# Patient Record
Sex: Male | Born: 1965
Health system: Southern US, Community
[De-identification: ages and names within clinical notes are randomized; demographics above are authoritative.]

## PROBLEM LIST (undated history)

## (undated) DIAGNOSIS — F39 Unspecified mood [affective] disorder: Secondary | ICD-10-CM

## (undated) DIAGNOSIS — F319 Bipolar disorder, unspecified: Secondary | ICD-10-CM

## (undated) HISTORY — DX: Unspecified mood (affective) disorder: F39

---

## 2000-06-14 ENCOUNTER — Emergency Department (HOSPITAL_COMMUNITY): Admission: EM | Admit: 2000-06-14 | Discharge: 2000-06-14 | Payer: Self-pay

## 2009-06-03 HISTORY — PX: CERVICAL DISCECTOMY: SHX98

## 2009-11-09 ENCOUNTER — Ambulatory Visit (HOSPITAL_COMMUNITY): Admission: RE | Admit: 2009-11-09 | Discharge: 2009-11-10 | Payer: Self-pay | Admitting: Orthopedic Surgery

## 2010-08-20 LAB — CBC
MCHC: 35.2 g/dL (ref 30.0–36.0)
MCV: 94.4 fL (ref 78.0–100.0)
Platelets: 203 10*3/uL (ref 150–400)
RBC: 4.49 MIL/uL (ref 4.22–5.81)
RDW: 12.2 % (ref 11.5–15.5)

## 2015-05-05 ENCOUNTER — Ambulatory Visit (INDEPENDENT_AMBULATORY_CARE_PROVIDER_SITE_OTHER): Payer: 59 | Admitting: Family Medicine

## 2015-05-05 VITALS — BP 132/82 | HR 82 | Temp 98.6°F | Resp 16 | Ht 71.0 in | Wt 193.0 lb

## 2015-05-05 DIAGNOSIS — Z23 Encounter for immunization: Secondary | ICD-10-CM | POA: Diagnosis not present

## 2015-05-05 DIAGNOSIS — Z131 Encounter for screening for diabetes mellitus: Secondary | ICD-10-CM | POA: Diagnosis not present

## 2015-05-05 DIAGNOSIS — Z13220 Encounter for screening for lipoid disorders: Secondary | ICD-10-CM | POA: Diagnosis not present

## 2015-05-05 DIAGNOSIS — Z0000 Encounter for general adult medical examination without abnormal findings: Secondary | ICD-10-CM

## 2015-05-05 DIAGNOSIS — Z130 Encounter for screening for diseases of the blood and blood-forming organs and certain disorders involving the immune mechanism: Secondary | ICD-10-CM | POA: Diagnosis not present

## 2015-05-05 LAB — LIPID PANEL
Cholesterol: 240 mg/dL — ABNORMAL HIGH (ref 125–200)
HDL: 45 mg/dL (ref >=40)
LDL CALC: 119 mg/dL (ref <130)
Total CHOL/HDL Ratio: 5.3 Ratio — ABNORMAL HIGH (ref <=5.0)
Triglycerides: 381 mg/dL — ABNORMAL HIGH (ref <150)
VLDL: 76 mg/dL — AB (ref <30)

## 2015-05-05 LAB — CBC
HCT: 45.6 % (ref 39.0–52.0)
Hemoglobin: 15.7 g/dL (ref 13.0–17.0)
MCH: 32.4 pg (ref 26.0–34.0)
MCHC: 34.4 g/dL (ref 30.0–36.0)
MCV: 94.2 fL (ref 78.0–100.0)
MPV: 9.8 fL (ref 8.6–12.4)
Platelets: 250 10*3/uL (ref 150–400)
RBC: 4.84 MIL/uL (ref 4.22–5.81)
RDW: 12.9 % (ref 11.5–15.5)
WBC: 5.8 10*3/uL (ref 4.0–10.5)

## 2015-05-05 LAB — COMPREHENSIVE METABOLIC PANEL
ALT: 25 U/L (ref 9–46)
AST: 17 U/L (ref 10–40)
Albumin: 4.8 g/dL (ref 3.6–5.1)
Alkaline Phosphatase: 46 U/L (ref 40–115)
BILIRUBIN TOTAL: 0.4 mg/dL (ref 0.2–1.2)
BUN: 15 mg/dL (ref 7–25)
CO2: 27 mmol/L (ref 20–31)
CREATININE: 0.81 mg/dL (ref 0.60–1.35)
Calcium: 9 mg/dL (ref 8.6–10.3)
Chloride: 101 mmol/L (ref 98–110)
GLUCOSE: 103 mg/dL — AB (ref 65–99)
Potassium: 3.8 mmol/L (ref 3.5–5.3)
SODIUM: 138 mmol/L (ref 135–146)
Total Protein: 6.9 g/dL (ref 6.1–8.1)

## 2015-05-05 NOTE — Progress Notes (Addendum)
[facility] [address], [place] [address] [phone]  Date:  05/05/2015   Name:  Trevor Yoder   DOB:  1965/07/22   MRN:  [id number]  PCP:  No primary care provider on file.    Chief Complaint: Annual Exam and Immunizations   History of Present Illness:  Trevor Yoder is a 49 y.o. very pleasant male patient who presents with the following:  He is here today for a CPE- new patient today His last tetanus was 2001 he thinks- would like to get a tdap today.  He is no longer a smoker.  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   Past Medical History  Diagnosis Date  . Mood disorder [facility])     Past Surgical History  Procedure Laterality Date  . Cervical discectomy  2011    Social History  Substance Use Topics  . Smoking status: Former Smoker    Quit date: 04/27/2009  . Smokeless tobacco: None  . Alcohol Use: 6.0 oz/week    10 Standard drinks or equivalent per week    Family History  Problem Relation Age of Onset  . Diabetes Mother   . Hyperlipidemia Mother   . Hypertension Mother   . Heart disease Maternal Grandmother   . Cancer Paternal Grandfather     No Known Allergies  Medication list has been reviewed and updated.  No current outpatient prescriptions on file prior to visit.   No current facility-administered medications on file prior to visit.    Review of Systems:  As per HPI- otherwise negative.   Physical Examination: Filed Vitals:   05/05/15 1407  BP: 132/82  Pulse: 82  Temp: 98.6 F (37 C)  Resp: 16   Filed Vitals:   05/05/15 1407  Height: 5\' 11"  (1.803 m)  Weight: 193 lb (87.544 kg)   Body mass index is 26.93 kg/(m^2). Ideal Body Weight: Weight in (lb) to have BMI  = 25: 178.9  GEN: WDWN, NAD, Non-toxic, A & O x 3, looks well.  HEENT: Atraumatic, Normocephalic. Neck supple. No masses, No LAD.  Bilateral TM wnl, oropharynx normal.  PEERL,EOMI.   Ears and Nose: No external deformity. CV: RRR, No M/G/R. No JVD. No thrill. No extra heart sounds. PULM: CTA B, no wheezes, crackles, rhonchi. No retractions. No resp. distress. No accessory muscle use. ABD: S, NT, ND. No rebound. No HSM. EXTR: No c/c/e NEURO Normal gait.  PSYCH: Normally interactive. Conversant. Not depressed or anxious appearing.  Calm demeanor.    Assessment and Plan: Physical exam  Immunization due - Plan: Tdap vaccine greater than or equal to 7yo IM  Screening for deficiency anemia - Plan: CBC  Screening for diabetes mellitus - Plan: Comprehensive metabolic panel, Hemoglobin A1c  Screening for hyperlipidemia - Plan: Lipid panel  CPE today and form for his job He is NOT fasting today- wanted to go ahead and do labs since he is here but may need to re-do chl if high Encouraged exercise- he just got a new puppy and plans to be more active taking her on walks Will plan further follow- up pending labs.   Signed Lamar Blinks, MD  Received his labs and  gave him a call- diabetes test is ok, but he may want to repeat his cholesterol fasting.  Will place an order for this

## 2015-05-05 NOTE — Patient Instructions (Signed)
It was good to see you today- I will be in touch with your labs and will fax in your form when labs are in I hope that you enjoy your new puppy!

## 2015-05-06 LAB — HEMOGLOBIN A1C
HEMOGLOBIN A1C: 5.4 % (ref <5.7)
Mean Plasma Glucose: 108 mg/dL (ref <117)

## 2015-05-06 NOTE — Addendum Note (Signed)
Addended by: Lamar Blinks C on: 05/06/2015 10:29 AM   Modules accepted: Orders

## 2015-05-11 ENCOUNTER — Other Ambulatory Visit (INDEPENDENT_AMBULATORY_CARE_PROVIDER_SITE_OTHER): Payer: 59

## 2015-05-11 DIAGNOSIS — Z13220 Encounter for screening for lipoid disorders: Secondary | ICD-10-CM

## 2015-05-11 LAB — LIPID PANEL
CHOL/HDL RATIO: 4.4 ratio (ref <=5.0)
Cholesterol: 230 mg/dL — ABNORMAL HIGH (ref 125–200)
HDL: 52 mg/dL (ref >=40)
LDL CALC: 133 mg/dL — AB (ref <130)
TRIGLYCERIDES: 224 mg/dL — AB (ref <150)
VLDL: 45 mg/dL — AB (ref <30)

## 2015-05-11 NOTE — Progress Notes (Signed)
Pt. Came in for a lab only visit.

## 2015-05-12 ENCOUNTER — Telehealth: Payer: Self-pay | Admitting: Physician Assistant

## 2015-05-12 NOTE — Telephone Encounter (Signed)
Please call the patient.  I have filled out his form for him to come and pick-up.  His cholesterol was slightly better when he was fasting.  I would work on good healthy eating and exercise.  Dr Lorelei Pont will be in touch with him about the next step.

## 2015-05-14 ENCOUNTER — Encounter: Payer: Self-pay | Admitting: Family Medicine

## 2015-05-14 DIAGNOSIS — E782 Mixed hyperlipidemia: Secondary | ICD-10-CM | POA: Insufficient documentation

## 2015-05-15 NOTE — Telephone Encounter (Signed)
.  Advised form ready to pick up or faxed to destination.

## 2016-11-18 ENCOUNTER — Ambulatory Visit (INDEPENDENT_AMBULATORY_CARE_PROVIDER_SITE_OTHER): Payer: 59 | Admitting: Podiatry

## 2016-11-18 ENCOUNTER — Ambulatory Visit (INDEPENDENT_AMBULATORY_CARE_PROVIDER_SITE_OTHER): Payer: 59

## 2016-11-18 DIAGNOSIS — M205X9 Other deformities of toe(s) (acquired), unspecified foot: Secondary | ICD-10-CM

## 2016-11-18 DIAGNOSIS — R52 Pain, unspecified: Secondary | ICD-10-CM

## 2016-11-20 DIAGNOSIS — M205X9 Other deformities of toe(s) (acquired), unspecified foot: Secondary | ICD-10-CM | POA: Insufficient documentation

## 2016-11-20 NOTE — Progress Notes (Signed)
Subjective:    Patient ID: Trevor Yoder, male   DOB: 51 y.o.   MRN: [id number]   HPI 51 year old male presents the today for concerns of "hallux limitus" to both of his feet with the right side worse than left. States his been ongoing for quite some time but recently become more painful with weightbearing or pressure and doing a lot of walking. He denies any recent njuryor trauma to the area and he denies any recent treatment other than trying to change shoes. Discussed treatment options today for this. He has no other concerns today.  Review of Systems  All other systems reviewed and are negative.       Objective:  Physical Exam General: AAO x3, NAD  Dermatological: Skin is warm, dry and supple bilateral. Nails x 10 are well manicured; remaining integument appears unremarkable at this time. There are no open sores, no preulcerative lesions, no rash or signs of infection present.  Vascular: Dorsalis Pedis artery and Posterior Tibial artery pedal pulses are 2/4 bilateral with immedate capillary fill time. Pedal hair growth present. No varicosities and no lower extremity edema present bilateral. There is no pain with calf compression, swelling, warmth, erythema.   Neruologic: Grossly intact via light touch bilateral. . Protective threshold with Semmes Wienstein monofilament intact to all pedal sites bilateral.   Musculoskeletal: dorsal spurring is present off the first metatarsophalangeal joint bilaterally with the right side worse than the left and there is decreased range of motion of the first MTPJ. There is tenderness the dorsal aspect of the MTPJ on the bone spurs. There is minimal swelling to this area is no erythema or increase in warmth. There is no other area of tenderness identified at this time.Muscular strength 5/5 in all groups tested bilateral.  Gait: Unassisted, Nonantalgic.      Assessment:     51 year old male with bilateral 1st MTPJ hallux limitus right > left     Plan:  -Treatment options discussed including all alternatives, risks, and complications -Etiology of symptoms were discussed -X-rays were obtained and reviewed with the patient. Dorsal spurring present of the first metatarsal bilaterally. Arthritic changes present of the first MTPJ. No evidence of acute fracture identified. -Today with discussed both conservative and surgical treatment options. Conservatively we discussed a custom mold  As well she can modifications and injection therapy. Surgically we discussed with him the first MTPJ arthrodesis or possibly a Cartiva implant. I discussed each of these surgeries as well as the postoperative course as well as expected outcomes. He wouldlikely proceed with surgery but nor another couple of months. He also like an insurance quote at this time which we will get for him. In the meantime discuss continue with supportive shoes, stiffer soled shoe as well as offloading pads.     Celesta Gentile, DPM

## 2016-12-10 ENCOUNTER — Telehealth: Payer: Self-pay | Admitting: *Deleted

## 2016-12-10 NOTE — Telephone Encounter (Signed)
"  I'm calling to see how much time will be needed for my upcoming surgery.  I'll trying to get an estimate for the facility as well as anesthesia.  I have the codes. The first one is [phone]."  A Trevor Yoder takes a hour.  "I'm going to do both feet at the same time because the costs for the surgical center is ridiculous.  Can you tell me how long it will take for the other procedure?  That code is [phone]."  It will take a hour for each foot.  "Thanks, that's all I needed to know."

## 2016-12-11 ENCOUNTER — Telehealth: Payer: Self-pay | Admitting: *Deleted

## 2016-12-11 NOTE — Telephone Encounter (Signed)
"  I'd like to schedule a surgery with Dr. Jacqualyn Posey about six weeks out."  Have you signed consent forms?  "No, I just saw him for an initial visit."  I can put you down tentatively for a date.  What date do you have in mind?  He does surgeries on Wednesdays.  "I'd like to schedule it the last week of August."  He can do it on August 29.  "Please put me down tentatively."  I will transfer you to a scheduler to schedule an appointment for a consultation.  Call was transferred to a scheduler.

## 2016-12-13 ENCOUNTER — Telehealth: Payer: Self-pay | Admitting: Podiatry

## 2016-12-13 NOTE — Telephone Encounter (Signed)
Left voicemail for pt. To call to schedule appointment for surgical consultation

## 2017-01-06 ENCOUNTER — Encounter: Payer: Self-pay | Admitting: Podiatry

## 2017-01-06 ENCOUNTER — Ambulatory Visit (INDEPENDENT_AMBULATORY_CARE_PROVIDER_SITE_OTHER): Payer: 59 | Admitting: Podiatry

## 2017-01-06 DIAGNOSIS — M205X9 Other deformities of toe(s) (acquired), unspecified foot: Secondary | ICD-10-CM

## 2017-01-06 NOTE — Patient Instructions (Signed)

## 2017-01-09 NOTE — Progress Notes (Signed)
Subjective: Mr. Barton presents the office today for concerns of bilateral foot pain on the big toe joints with the left side worse than the right today. Last upon the right side was worse than left he states that besides her intermittently. He states that he was active the last couple weeks he states he is having quite a bit of pain to the big toe joints and directed further discuss surgery. He has attempted shoe changes, offloading pad and a significant improvement. Denies any systemic complaints such as fevers, chills, nausea, vomiting. No acute changes since last appointment, and no other complaints at this time.   Objective: AAO x3, NAD DP/PT pulses palpable bilaterally, CRT less than 3 seconds Decreased range of motion the bilateral first MTPJ as well as dorsal spurring present first MPJ. No significant crepitation with MPJ range of motion however there is pain with range of motion. There is no edema, erythema, increase in warmth. As noted. Tenderness at about this time.  No open lesions or pre-ulcerative lesions.  No pain with calf compression, swelling, warmth, erythema  Assessment: Bilateral hallux limitus   Plan: -All treatment options discussed with the patient including all alternatives, risks, complications.  -X-rays and chart reviewed with patient. -We discussed both conservative and surgical treatment options. At this point he will to go and proceed with surgical intervention. He is actually requesting "Cartiva" implant to bilateral MPJs. He states that he was going to proceed with surgery bilaterally. He states that due to work in rather discoloration to both sides although we did discuss not doing his last appointment but he would like to go ahead and do this. I discussed the surgery as well as the postoperative course. We discussed risks of the surgeries well. -We discussed that this is a newer implant and technique. We do not yet know the long-term results of this. Discussed  with him that the chance of failure and he isn't likely to need further surgery later in life if not sooner. No promises or guarantees were given. -The incision placement as well as the postoperative course was discussed with the patient. I discussed risks of the surgery which include, but not limited to, infection, bleeding, pain, swelling, need for further surgery, delayed or nonhealing, painful or ugly scar, numbness or sensation changes, over/under correction, recurrence, transfer lesions, further deformity, hardware failure, DVT/PE, loss of toe/foot. Patient understands these risks and wishes to proceed with surgery. The surgical consent was reviewed with the patient all 3 pages were signed. No promises or guarantees were given to the outcome of the procedure. All questions were answered to the best of my ability. Before the surgery the patient was encouraged to call the office if there is any further questions. The surgery will be performed at the [facility] on an outpatient basis. -Patient encouraged to call the office with any questions, concerns, change in symptoms.   Celesta Gentile, DPM

## 2017-01-14 ENCOUNTER — Telehealth: Payer: Self-pay | Admitting: *Deleted

## 2017-01-14 NOTE — Telephone Encounter (Addendum)
"  I was in there a week ago.  I was supposed to be scheduling surgery to get some implants in my feet.  I was hoping to get Wednesday August 29.  We spoke before about that being the date.  I just want to make sure that has been confirmed and everything is ready to go.  Please call me back."  "My name is Trevor Yoder.  My husband, Dvontae has a surgery that's upcoming.  I believe it's the 29th of August or in process.  We need some information about the surgery.  We need the date confirmed and other details so we can plan some time off from work.  We need to plan the financial piece of it.  We just really need to fill in some of these missing blanks as soon as we can get information."  I left them a message that I do have him scheduled for surgery on August 29.  Call tomorrow if you have any additional questions.

## 2017-01-16 NOTE — Telephone Encounter (Signed)
"  I'm returning your call.  You tried to reach me today."  I did not call you.  I left you a message the other day that we have you scheduled for August 29 for surgery.  Maybe it was someone from the surgical center.  "It possibly could have been I just filled out all the information on the online portal.

## 2017-01-29 ENCOUNTER — Encounter: Payer: Self-pay | Admitting: Podiatry

## 2017-01-29 DIAGNOSIS — M2022 Hallux rigidus, left foot: Secondary | ICD-10-CM | POA: Diagnosis not present

## 2017-01-29 DIAGNOSIS — M2021 Hallux rigidus, right foot: Secondary | ICD-10-CM | POA: Diagnosis not present

## 2017-01-29 NOTE — Progress Notes (Signed)
Pre-operative Note  Patient presents to the [facility] today for surgical intervention of the left and right foot for hallux limitus. He has elected and came in requesting a Cartiva implant bilaterally. We discussed options and long term success rates and how this is a newer implant and we do not know long term success. The surgical consent was reviewed with the patient and we discussed the procedure as well as the postoperative course. I again discussed all alternatives, risks, complications. I answered all of their questions to the best of my ability and they wish to proceed with surgery. No promises or guarantees were given as to the outcome of the surgery.   The surgical consent was signed.   Patient is NPO since midnight.  The patient does not have have a history of blood clots or bleeding disorders.   No further questions.   Celesta Gentile, [place]

## 2017-01-30 ENCOUNTER — Telehealth: Payer: Self-pay | Admitting: Podiatry

## 2017-01-30 NOTE — Telephone Encounter (Addendum)
Pt states he has been icing over 15 minutes/hour is that okay, was it okay to take the socks off, do movement of ankle and toes with muscles not hands, put boots on loosely to go to the bathroom or move around, and Dr. Jacqualyn Posey had said he could take ibuprofen or aspirin between doses of Percocet. I told pt to ice 15 minutes/hour, but longer was not necessary, okay to take the socks off, but could use loose athletic socks to keep dressings clean and intact, best to have surgery boots on securely to ensure good foot positioning and safety. I initially told pt that he should only take the ibuprofen between the percocet, but pt states he was told by Dr. Jacqualyn Posey to take an aspirin. I told pt to take the ibuprofen as OTC package directs and aspirin as directed by Dr. Jacqualyn Posey. I reviewed the post op care sheet with pt and he states understanding.

## 2017-01-30 NOTE — Telephone Encounter (Signed)
I had surgery on both of my feet yesterday by Dr. Jacqualyn Posey and I just have a couple of questions.

## 2017-02-06 ENCOUNTER — Ambulatory Visit (INDEPENDENT_AMBULATORY_CARE_PROVIDER_SITE_OTHER): Payer: 59

## 2017-02-06 ENCOUNTER — Ambulatory Visit (INDEPENDENT_AMBULATORY_CARE_PROVIDER_SITE_OTHER): Payer: Self-pay | Admitting: Podiatry

## 2017-02-06 ENCOUNTER — Ambulatory Visit: Payer: 59

## 2017-02-06 DIAGNOSIS — M205X9 Other deformities of toe(s) (acquired), unspecified foot: Secondary | ICD-10-CM

## 2017-02-11 NOTE — Progress Notes (Signed)
Subjective: Trevor Yoder is a 51 y.o. is seen today in office s/p bilateral chilectomy with Cativa implant bilateal preformed on 01/29/2017. They state their pain is controlled currently. He has finished her course of his pain medication and does not want a refill. He is remain in surgical shoes bilaterally. Denies any systemic complaints such as fevers, chills, nausea, vomiting. No calf pain, chest pain, shortness of breath.   Objective: General: No acute distress, AAOx3  DP/PT pulses palpable 2/4, CRT < 3 sec to all digits.  Protective sensation intact. Motor function intact.  Bialtearl foot: Incision is well coapted without any evidence of dehiscence and sutures are inact. There is no surrounding erythema, ascending cellulitis, fluctuance, crepitus, malodor, drainage/purulence. There is mild edema around the surgical site. There is minimal pain along the surgical site. Ecchymosis present the digits. There is no apparent MPJ range of motion today. No other areas of tenderness to bilateral lower extremities.  No other open lesions or pre-ulcerative lesions.  No pain with calf compression, swelling, warmth, erythema.   Assessment and Plan:  Status post bilateral foot surgery, doing well with no complications   -Treatment options discussed including all alternatives, risks, and complications -X-rays were obtained and reviewed with the patient. Evidence of implant in the first metatarsal phalangeal joint. Status post cheilectomy. No evidence of acute fracture. -Antibiotic ointment was applied followed by a bandage. Keep the dressing clean, dry, intact -Remain in surgical shoes at all times -Ice/elevation -Pain medication as needed. -Monitor for any clinical signs or symptoms of infection and DVT/PE and directed to call the office immediately should any occur or go to the ER. -Follow-up as scheduled for suture removal or sooner if any problems arise. In the meantime, encouraged to call the  office with any questions, concerns, change in symptoms.   Celesta Gentile, DPM

## 2017-02-13 ENCOUNTER — Ambulatory Visit (INDEPENDENT_AMBULATORY_CARE_PROVIDER_SITE_OTHER): Payer: Self-pay | Admitting: Podiatry

## 2017-02-13 ENCOUNTER — Encounter: Payer: Self-pay | Admitting: Podiatry

## 2017-02-13 DIAGNOSIS — M205X9 Other deformities of toe(s) (acquired), unspecified foot: Secondary | ICD-10-CM

## 2017-02-13 NOTE — Progress Notes (Signed)
Subjective: Trevor Yoder is a 51 y.o. is seen today in office s/p bilateral chilectomy with Cativa implant bilateal preformed on 01/29/2017. They state their pain is controlled currently and not taking any medication. He does state that he walks without the surgical shoes while at the house but does wear them while at work. He does ice his foot as much as he can. He states that he gets some occasional discomfort the toes already feel better. He states that he does a lot of walking his "foot will let me know" he states is not pain just some discomfort.  Denies any systemic complaints such as fevers, chills, nausea, vomiting. No calf pain, chest pain, shortness of breath.   Objective: General: No acute distress, AAOx3  DP/PT pulses palpable 2/4, CRT < 3 sec to all digits.  Protective sensation intact. Motor function intact.  Bilateral feet: Incision is well coapted without any evidence of dehiscence and sutures are inact.  Incision appears to be healing well. There is no drainage or pus there is no surrounding erythema or ascending synovitis. There is no fluctuance or crepitus. There is no clinical signs of infection present. Ecchymosis of the hallux but this appears to be improving. There is improved range of motion the first MPJs bilaterally there is no pain with MPJ range of motion today there is no crepitation. Subjective numbness along the interspace on the right foot. There is mild swelling that continues but appears to be improved compared to last appointment.  No other areas of tenderness to bilateral lower extremities.  No other open lesions or pre-ulcerative lesions.  No pain with calf compression, swelling, warmth, erythema.   Assessment and Plan:  Status post bilateral foot surgery, doing well with no complications   -Treatment options discussed including all alternatives, risks, and complications -Suture ends were cut today. Steri-Strips are applied for reinforcement although the  incision remained well coapted. Antibiotic when it was applied followed by a bandage. -He can start to shower tomorrow. Keep antibiotic ointment and a bandage on. -Surgical shoe at all times. -Gentle range of motion exercises of the first MPJ -Ice and elevation -Limit activity -Monitor for any clinical signs or symptoms of infection and DVT/PE and directed to call the office immediately should any occur or go to the ER. -Follow-up in 2 weeks or sooner if any problems arise. In the meantime, encouraged to call the office with any questions, concerns, change in symptoms.   Celesta Gentile, DPM

## 2017-02-27 ENCOUNTER — Ambulatory Visit (INDEPENDENT_AMBULATORY_CARE_PROVIDER_SITE_OTHER): Payer: 59 | Admitting: Podiatry

## 2017-02-27 ENCOUNTER — Encounter: Payer: Self-pay | Admitting: Podiatry

## 2017-02-27 ENCOUNTER — Ambulatory Visit (INDEPENDENT_AMBULATORY_CARE_PROVIDER_SITE_OTHER): Payer: 59

## 2017-02-27 DIAGNOSIS — M205X9 Other deformities of toe(s) (acquired), unspecified foot: Secondary | ICD-10-CM

## 2017-02-27 NOTE — Progress Notes (Signed)
Subjective: Trevor Yoder is a 51 y.o. is seen today in office s/p bilateral chilectomy with Cativa implant bilateal preformed on 01/29/2017. He states his been doing well. His his feet when the surgical shoes becoming more active and overall he is doing better. He states the left side is doing better and the right side. He states he is ready to bring to regular shoe although he still has some swelling which was difficult but he has tried. Denies any recent injury or trauma. Denies any systemic complaints such as fevers, chills, nausea, vomiting. No calf pain, chest pain, shortness of breath.   Objective: General: No acute distress, AAOx3  DP/PT pulses palpable 2/4, CRT < 3 sec to all digits.  Protective sensation intact. Motor function intact.  Bilateral feet: Incision is well coapted without any evidence of dehiscence and sutures are inact.  Incision appears to be healing well. There is no drainage or pus there is no surrounding erythema or ascending cellulitis. There is no fluctuance or crepitus. There is no clinical signs of infection present. Ecchymosis of the hallux but this appears to be improving and minimal. There is improved range of motion the first MPJs bilaterally there is no pain with MPJ range of motion today there is no crepitation. Saw his some numbness the interspace and the right foot but overall this appears to be improved. There is mild swelling to the right foot compared to the left foot area No other areas of tenderness to bilateral lower extremities.  No other open lesions or pre-ulcerative lesions.  No pain with calf compression, swelling, warmth, erythema.   Assessment and Plan:  Status post bilateral foot surgery, doing well with no complications   -Treatment options discussed including all alternatives, risks, and complications -X-rays were taken and reviewed. Status post implant the first metatarsal head. No evidence of acute fracture. -There is a single suture which  remained intact the proximal aspect incision on the right foot which is admitted to without any complications. Continued antibiotic ointment dressing changes daily. -Continue surgical shoes, ice, elevation. We will start physical therapy and prescription was provided for this today. He can transition to regular shoe as tolerated. -Monitor for any clinical signs or symptoms of infection and directed to call the office immediately should any occur or go to the ER. -RTC 3 weeks or sooner if needed.  Celesta Gentile, DPM

## 2017-03-11 NOTE — Progress Notes (Signed)
DOS 01/29/17 LT and Rt foot cleaning of big toe joints to remove bone spurs and application of Cartiva implants into bilateral big toe joints

## 2017-03-20 ENCOUNTER — Ambulatory Visit (INDEPENDENT_AMBULATORY_CARE_PROVIDER_SITE_OTHER): Payer: 59 | Admitting: Podiatry

## 2017-03-20 ENCOUNTER — Encounter: Payer: Self-pay | Admitting: Podiatry

## 2017-03-20 ENCOUNTER — Ambulatory Visit (INDEPENDENT_AMBULATORY_CARE_PROVIDER_SITE_OTHER): Payer: 59

## 2017-03-20 DIAGNOSIS — M779 Enthesopathy, unspecified: Secondary | ICD-10-CM

## 2017-03-20 DIAGNOSIS — M205X9 Other deformities of toe(s) (acquired), unspecified foot: Secondary | ICD-10-CM | POA: Diagnosis not present

## 2017-03-20 DIAGNOSIS — M7750 Other enthesopathy of unspecified foot: Secondary | ICD-10-CM

## 2017-03-20 NOTE — Progress Notes (Signed)
Subjective: Trevor Yoder is a 51 y.o. is seen today in office s/p bilateral chilectomy with Cativa implant bilateal preformed on 01/29/2017. He states that he is doing better. He has returned to regular shoe. He states he has been active he still gets some discomfort to the surgical sites. He does feel it is somewhat improved compared to prior to surgery. He dusted that he jammed his right big toe and had increasing pain after this however the pain is subsiding. No other injury. He has no other concerns today. Denies any systemic complaints such as fevers, chills, nausea, vomiting. No calf pain, chest pain, shortness of breath.   Objective: General: No acute distress, AAOx3  DP/PT pulses palpable 2/4, CRT < 3 sec to all digits.  Protective sensation intact. Motor function intact.  Bilateral feet: Incision is well coapted without any evidence of dehiscence and a scar has formed. Swelling continues on the right first MPJ although about the same or mildly improved. There is no erythema or increase in warmth. There is no clinical signs of infection present. There is no pain with MPJ range of motion today. He states he still gets some soreness like he was having prior to surgery but it is improved compared to before surgery he believes. Is no clinical signs of infection present bilaterally. No other open lesions or pre-ulcerative lesions.  No pain with calf compression, swelling, warmth, erythema.   Assessment and Plan:  Status post bilateral foot surgery  -Treatment options discussed including all alternatives, risks, and complications -X-rays were taken and reviewed. Status post implant the first metatarsal head. No evidence of acute fracture. The implant does appear to be subsided.  -At this point he states that he was hoping for some more improvement at this point postoperatively we does feel he has made some improvement. He also states that he knew that this was a new procedure and unsure of how  long the last. However at this time he is still 6 weeks and surgery and his pain has improved compared to preop. I will let to go ahead and get him molded for orthotics today. He was seen today by Liliane Channel for this.  -Continue to ice -Gradual return to activity.  -PT -RTC in 3 weeks or sooner if needed.   Celesta Gentile, DPM  ----- Patient brought in by dr wagoner for scan/casting for CMFO to address FHL.  patietn scanned ST neutral w/ 1st Ray plantarflexed, hallux dorsiflexed.  Richy to fab semi rigid device, deep heel cup, spenco cover w/ reverse morton extension to keep first ray dropped to enhance windlass effect in first MPJ. Keiara Sneeringer Cped

## 2017-04-10 ENCOUNTER — Ambulatory Visit (INDEPENDENT_AMBULATORY_CARE_PROVIDER_SITE_OTHER): Payer: 59

## 2017-04-10 ENCOUNTER — Ambulatory Visit (INDEPENDENT_AMBULATORY_CARE_PROVIDER_SITE_OTHER): Payer: 59 | Admitting: Podiatry

## 2017-04-10 DIAGNOSIS — M205X9 Other deformities of toe(s) (acquired), unspecified foot: Secondary | ICD-10-CM

## 2017-04-10 NOTE — Addendum Note (Signed)
Addended by: Celesta Gentile R on: 04/10/2017 07:16 PM   Modules accepted: Level of Service

## 2017-04-10 NOTE — Addendum Note (Signed)
Addended by: Cranford Mon R on: 04/10/2017 09:02 AM   Modules accepted: Orders

## 2017-04-10 NOTE — Progress Notes (Addendum)
  Trevor Yoder: Patient came in today to pick up custom made foot orthotics.  The goals were accomplished and the patient reported no dissatisfaction with said orthotics.  Patient was advised of breakin period and how to report any issues.  ----------------------  Subjective: Trevor Yoder is a 51 y.o. is seen today in office s/p bilateral chilectomy with Cativa implant bilateal preformed on 01/29/2017.  He feels that overall he is doing better.  He finished physical therapy he felt that he could do the same thing at home that he can accomplish by going.  Overall since his last appointment he has improved and he states that he is happy with the outcome of the surgery so far.  Overall his pain has improved compared to what it was prior to surgery.  He also states the swelling has improved.  He denies any redness or warmth.  He has been wearing a regular shoe.  He also presents today to pick up orthotics.  See above note.  He has no other concerns today.Denies any systemic complaints such as fevers, chills, nausea, vomiting. No calf pain, chest pain, shortness of breath.   Objective: General: No acute distress, AAOx3  DP/PT pulses palpable 2/4, CRT < 3 sec to all digits.  Protective sensation intact. Motor function intact.  Bilateral feet: Incision is well coapted without any evidence of dehiscence and a scar has formed.  Swelling appears to be improved.  There is no erythema, increase in warmth.  There is no tenderness palpation along the surgical sites.  Range of motion intact without any pain, crepitation.  Overall his symptoms have improved and he states he is happy with the outcome of the surgery at this point.  He has no other areas of tenderness identified today. No open lesions or pre-ulcerative lesion identified. No pain with calf compression, swelling, warmth, erythema.   Assessment and Plan:  Status post bilateral foot surgery, with improved symptoms  -Treatment options discussed including all  alternatives, risks, and complications -At this point he is doing well.  He is continued range of motion, rehab exercises at home and he feels that he has been making good improvements.  Orthotics were dispensed today by Liliane Channel.  On him to continue with this as well as well as supportive shoes.  Gradually continue to increase activity level.  At this point as he is doing well we will see him back as an as-needed basis and he agrees with this plan.  However, I encouraged him to call any questions, concerns or any change in symptoms.  Velna Ochs DPM Pedorthist

## 2018-04-20 ENCOUNTER — Other Ambulatory Visit: Payer: Self-pay | Admitting: Family Medicine

## 2018-04-20 DIAGNOSIS — N62 Hypertrophy of breast: Secondary | ICD-10-CM

## 2018-04-20 DIAGNOSIS — N6320 Unspecified lump in the left breast, unspecified quadrant: Secondary | ICD-10-CM

## 2018-04-28 ENCOUNTER — Other Ambulatory Visit: Payer: Self-pay

## 2018-04-29 ENCOUNTER — Ambulatory Visit: Payer: Self-pay

## 2018-04-29 ENCOUNTER — Ambulatory Visit
Admission: RE | Admit: 2018-04-29 | Discharge: 2018-04-29 | Disposition: A | Discharge status: Home / Self Care | Payer: BLUE CROSS/BLUE SHIELD | Source: Ambulatory Visit | Attending: Family Medicine | Admitting: Family Medicine

## 2018-04-29 DIAGNOSIS — N62 Hypertrophy of breast: Secondary | ICD-10-CM

## 2018-04-29 DIAGNOSIS — N6320 Unspecified lump in the left breast, unspecified quadrant: Secondary | ICD-10-CM

## 2018-05-05 ENCOUNTER — Other Ambulatory Visit: Payer: Self-pay

## 2018-05-05 MED ORDER — TRAZODONE HCL 100 MG PO TABS: ORAL_TABLET | ORAL | 2 refills | Status: DC

## 2018-07-10 ENCOUNTER — Telehealth: Payer: Self-pay | Admitting: Physician Assistant

## 2018-07-10 NOTE — Telephone Encounter (Signed)
Patient called and said that he wants to talk to you about his carbonmanizapine dosage. Please call him at [phone]

## 2018-07-13 NOTE — Telephone Encounter (Signed)
I didn't get his chart.  I'm pretty sure he missed an appt recently so I would like him to come in within the next month or so. If he needs the CBZ, would you please send in a month's supply (3 is okay too, whatever he usually gets.)  Thanks

## 2018-07-13 NOTE — Telephone Encounter (Signed)
Pt states he adjusted his carbamazepine dose to 200mg  in the am and 200mg  in the pm, it's helping his mood considerably. Wanted to let you know, if he needs an appt sooner he can schedule?  Uses [facility] on [address]   Not seen in epic will put paper chart in box

## 2018-07-14 ENCOUNTER — Other Ambulatory Visit: Payer: Self-pay

## 2018-07-14 MED ORDER — CARBAMAZEPINE 100 MG PO CHEW: CHEWABLE_TABLET | ORAL | 0 refills | Status: DC

## 2018-07-14 NOTE — Telephone Encounter (Signed)
Rx submitted to pharmacy, usually gets a 90 day supply. Will have him schedule office visit.

## 2018-07-15 ENCOUNTER — Encounter: Payer: Self-pay | Admitting: Physician Assistant

## 2018-07-15 ENCOUNTER — Ambulatory Visit: Payer: 59 | Admitting: Physician Assistant

## 2018-07-15 DIAGNOSIS — Z79899 Other long term (current) drug therapy: Secondary | ICD-10-CM | POA: Diagnosis not present

## 2018-07-15 DIAGNOSIS — F39 Unspecified mood [affective] disorder: Secondary | ICD-10-CM | POA: Diagnosis not present

## 2018-07-15 MED ORDER — CARBAMAZEPINE 100 MG PO CHEW: 200.0000 mg | CHEWABLE_TABLET | Freq: Two times a day (BID) | ORAL | 0 refills | Status: DC

## 2018-07-15 NOTE — Progress Notes (Signed)
[facility]  Patient ID: Trevor Yoder,  MRN: [id number]  PCP: London Pepper, MD  Date of Evaluation: 07/15/2018 Time spent:15 minutes  Chief Complaint:  Chief Complaint    Manic Behavior      HISTORY/CURRENT STATUS: HPI For the past 3 weeks or so, has been more 'mouthy', irritable, 'I can't talk fast enough, my mind won't shut up.  About 5 days ago, he increased the CBZ to total of 400mg /d.  He already feels better.  He had not been sleeping as well and has been having more energy as well.  No increased spending or increased libido.  Patient denies loss of interest in usual activities and is able to enjoy things.  Denies decreased energy or motivation.  Appetite has not changed.  No extreme sadness, tearfulness, or feelings of hopelessness.  Denies any changes in concentration, making decisions or remembering things.  Denies suicidal or homicidal thoughts.  Denies muscle or joint pain, stiffness, or dystonia.  Denies dizziness, syncope, seizures, numbness, tingling, tremor, tics, unsteady gait, slurred speech, confusion.   Individual Medical History/ Review of Systems: Changes? :No    Past medications for mental health diagnoses include: Equetro, trazodone  Allergies: Patient has no known allergies.  Current Medications:  Current Outpatient Medications:  .  carbamazepine (TEGRETOL) 100 MG chewable tablet, Chew 2 tablets (200 mg total) by mouth 2 (two) times daily. 2 qam, 2 qhs, Disp: 360 tablet, Rfl: 0 .  traZODone (DESYREL) 100 MG tablet, Take 1-2 Tablets by mouth at Bedtime as needed for sleep., Disp: 180 tablet, Rfl: 2 .  oxyCODONE-acetaminophen (PERCOCET/ROXICET) 5-325 MG tablet, TAKE 1-2 TABLETS BY MOUTH EVERY 4-6HRS AS NEEDED FOR PAIN, Disp: , Rfl: 0 .  promethazine (PHENERGAN) 25 MG tablet, TAKE 1 TABLET BY MOUTH EVERY 8 HOURS AS NEEDED FOR A NAUSEA / VOMITING, Disp: , Rfl: 0 Medication Side Effects: none  Family Medical/ Social History: Changes?  No  MENTAL HEALTH EXAM:  There were no vitals taken for this visit.There is no height or weight on file to calculate BMI.  General Appearance: Casual and Well Groomed  Eye Contact:  Good  Speech:  Clear and Coherent  Volume:  Normal  Mood:  Euthymic  Affect:  Appropriate  Thought Process:  Goal Directed  Orientation:  Full (Time, Place, and Person)  Thought Content: Logical   Suicidal Thoughts:  No  Homicidal Thoughts:  No  Memory:  WNL  Judgement:  Good  Insight:  Good  Psychomotor Activity:  Normal  Concentration:  Concentration: Good  Recall:  Good  Fund of Knowledge: Good  Language: Good  Assets:  Desire for Improvement  ADL's:  Intact  Cognition: WNL  Prognosis:  Good    DIAGNOSES:    ICD-10-CM   1. Episodic mood disorder (HCC) F39   2. Encounter for long-term (current) use of medications Z79.899 CBC with Differential/Platelet    Comprehensive metabolic panel    Carbamazepine level, total    Receiving Psychotherapy: No    RECOMMENDATIONS: Continue Tegretol 100 mg 2 p.o. twice daily. Continue trazodone 100 mg 1-2 nightly as needed. Obtain CBC level, CBC with differential, CMP in approximately 1 to 2 weeks. Return in 6 weeks.   Donnal Moat, PA-C

## 2018-07-20 ENCOUNTER — Encounter: Payer: Self-pay | Admitting: Emergency Medicine

## 2018-07-20 DIAGNOSIS — F39 Unspecified mood [affective] disorder: Secondary | ICD-10-CM | POA: Insufficient documentation

## 2018-08-19 LAB — COMPREHENSIVE METABOLIC PANEL
ALK PHOS: 48 IU/L (ref 39–117)
ALT: 17 IU/L (ref 0–44)
AST: 16 IU/L (ref 0–40)
Albumin/Globulin Ratio: 2.8 — ABNORMAL HIGH (ref 1.2–2.2)
Albumin: 4.7 g/dL (ref 3.8–4.9)
BUN/Creatinine Ratio: 12 (ref 9–20)
BUN: 11 mg/dL (ref 6–24)
Bilirubin Total: 0.4 mg/dL (ref 0.0–1.2)
CO2: 25 mmol/L (ref 20–29)
Calcium: 9.1 mg/dL (ref 8.7–10.2)
Chloride: 93 mmol/L — ABNORMAL LOW (ref 96–106)
Creatinine, Ser: 0.92 mg/dL (ref 0.76–1.27)
GFR calc Af Amer: 111 mL/min/{1.73_m2} (ref >59)
GFR calc non Af Amer: 96 mL/min/{1.73_m2} (ref >59)
Globulin, Total: 1.7 g/dL (ref 1.5–4.5)
Glucose: 110 mg/dL — ABNORMAL HIGH (ref 65–99)
Potassium: 4.4 mmol/L (ref 3.5–5.2)
Sodium: 133 mmol/L — ABNORMAL LOW (ref 134–144)
Total Protein: 6.4 g/dL (ref 6.0–8.5)

## 2018-08-19 LAB — CBC WITH DIFFERENTIAL/PLATELET
Basophils Absolute: 0 10*3/uL (ref 0.0–0.2)
Basos: 0 %
EOS (ABSOLUTE): 0 10*3/uL (ref 0.0–0.4)
Eos: 0 %
Hematocrit: 39 % (ref 37.5–51.0)
Hemoglobin: 14.2 g/dL (ref 13.0–17.7)
Immature Grans (Abs): 0 10*3/uL (ref 0.0–0.1)
Immature Granulocytes: 0 %
Lymphocytes Absolute: 1.1 10*3/uL (ref 0.7–3.1)
Lymphs: 18 %
MCH: 32.7 pg (ref 26.6–33.0)
MCHC: 36.4 g/dL — ABNORMAL HIGH (ref 31.5–35.7)
MCV: 90 fL (ref 79–97)
MONOS ABS: 0.5 10*3/uL (ref 0.1–0.9)
Monocytes: 9 %
Neutrophils Absolute: 4.3 10*3/uL (ref 1.4–7.0)
Neutrophils: 73 %
PLATELETS: 227 10*3/uL (ref 150–450)
RBC: 4.34 x10E6/uL (ref 4.14–5.80)
RDW: 11.8 % (ref 11.6–15.4)
WBC: 6 10*3/uL (ref 3.4–10.8)

## 2018-08-19 LAB — CARBAMAZEPINE LEVEL, TOTAL: Carbamazepine (Tegretol), S: 5.7 ug/mL (ref 4.0–12.0)

## 2018-08-19 NOTE — Progress Notes (Signed)
Left voice mail to call back 

## 2018-08-19 NOTE — Progress Notes (Signed)
Let him know labs are good.  Liver tests were fine. Carbamazepine level is good.  Stay on same dose of med.

## 2018-08-20 ENCOUNTER — Ambulatory Visit: Payer: 59 | Admitting: Physician Assistant

## 2018-09-02 ENCOUNTER — Ambulatory Visit (INDEPENDENT_AMBULATORY_CARE_PROVIDER_SITE_OTHER): Payer: 59 | Admitting: Physician Assistant

## 2018-09-02 ENCOUNTER — Encounter: Payer: Self-pay | Admitting: Physician Assistant

## 2018-09-02 DIAGNOSIS — F39 Unspecified mood [affective] disorder: Secondary | ICD-10-CM

## 2018-09-02 DIAGNOSIS — G4700 Insomnia, unspecified: Secondary | ICD-10-CM | POA: Diagnosis not present

## 2018-09-02 MED ORDER — CARBAMAZEPINE 100 MG PO CHEW: 200.0000 mg | CHEWABLE_TABLET | Freq: Two times a day (BID) | ORAL | 1 refills | Status: DC

## 2018-09-02 NOTE — Progress Notes (Addendum)
[facility]  Patient ID: Trevor Yoder,  MRN: [id number]  PCP: London Pepper, MD  Date of Evaluation: 09/02/2018 Time spent:15 minutes  Chief Complaint:  Chief Complaint    Follow-up     Virtual Visit via Telephone Note  I connected with Trevor Yoder on 09/02/18 at  4:00 PM EDT by telephone and verified that I am speaking with the correct person using two identifiers.   I discussed the limitations, risks, security and privacy concerns of performing an evaluation and management service by telephone and the availability of in person appointments. I also discussed with the patient that there may be a patient responsible charge related to this service. The patient expressed understanding and agreed to proceed.    HISTORY/CURRENT STATUS: HPI for 6-week med check  A few days prior to the last visit, the Tegretol had been increased from 300 mg daily to a total of 400 mg daily.  He was having increased irritability and anger.  Since then, those symptoms have improved greatly.  He had a very long hard project to deal with at work and feels like he was slightly manic during that time, not needing much sleep and having a lot more energy.  That was a good thing however because he ended up working 200 hours in 3 weeks time.  He has not had any impulsivity or risky behaviors, no increased libido or increased spending.  He is back to normal as far as the energy as well as his sleep.  He is actually sleeping really good with the trazodone, plus the fact that he is able to work from home now, he feels more rested for some reason.  Patient denies loss of interest in usual activities and is able to enjoy things.  Denies decreased energy or motivation.  Appetite has not changed.  No extreme sadness, tearfulness, or feelings of hopelessness.  Denies any changes in concentration, making decisions or remembering things.  Denies suicidal or homicidal thoughts.  Denies muscle or joint pain,  stiffness, or dystonia.  Denies dizziness, syncope, seizures, numbness, tingling, tremor, tics, unsteady gait, slurred speech, confusion.   Individual Medical History/ Review of Systems: Changes? :No    Past medications for mental health diagnoses include: Equetro, trazodone  Allergies: Patient has no known allergies.  Current Medications:  Current Outpatient Medications:  .  carbamazepine (TEGRETOL) 100 MG chewable tablet, Chew 2 tablets (200 mg total) by mouth 2 (two) times daily. 2 qam, 2 qhs, Disp: 360 tablet, Rfl: 1 .  traZODone (DESYREL) 100 MG tablet, Take 1-2 Tablets by mouth at Bedtime as needed for sleep., Disp: 180 tablet, Rfl: 2 .  oxyCODONE-acetaminophen (PERCOCET/ROXICET) 5-325 MG tablet, TAKE 1-2 TABLETS BY MOUTH EVERY 4-6HRS AS NEEDED FOR PAIN, Disp: , Rfl: 0 .  promethazine (PHENERGAN) 25 MG tablet, TAKE 1 TABLET BY MOUTH EVERY 8 HOURS AS NEEDED FOR A NAUSEA / VOMITING, Disp: , Rfl: 0 Medication Side Effects: none  Family Medical/ Social History: Changes?  He is working from home now because of social distancing from the coronavirus pandemic.  MENTAL HEALTH EXAM:  There were no vitals taken for this visit.There is no height or weight on file to calculate BMI.  General Appearance: Phone visit, unable to assess  Eye Contact:  Unable to assess  Speech:  Clear and Coherent  Volume:  Normal  Mood:  Euthymic  Affect:  Unable to assess  Thought Process:  Goal Directed  Orientation:  Full (Time, Place, and Person)  Thought Content: Logical   Suicidal Thoughts:  No  Homicidal Thoughts:  No  Memory:  WNL  Judgement:  Good  Insight:  Good  Psychomotor Activity:  Unable to assess  Concentration:  Concentration: Good  Recall:  Good  Fund of Knowledge: Good  Language: Good  Assets:  Desire for Improvement  ADL's:  Intact  Cognition: WNL  Prognosis:  Good  Labs drawn 08/18/2018 show CBC within normal limits CMP shows glucose of 110, sodium 133, chloride 93, otherwise  normal Carbamazepine level was 5.7, on a total of 400 mg of CBZ  DIAGNOSES:    ICD-10-CM   1. Episodic mood disorder (HCC) F39   2. Insomnia, unspecified type G47.00     Receiving Psychotherapy: No    RECOMMENDATIONS:   Continue Tegretol 100 mg chewable tablets, 2 p.o. twice daily. Continue trazodone 100 mg 1-2 nightly as needed sleep. Return in 9 months.  Donnal Moat, PA-C   This record has been created using [employer].  Chart creation errors have been sought, but may not always have been located and corrected. Such creation errors do not reflect on the standard of medical care.

## 2018-09-09 ENCOUNTER — Ambulatory Visit: Payer: 59 | Admitting: Physician Assistant

## 2018-11-11 ENCOUNTER — Ambulatory Visit: Payer: Self-pay | Admitting: Physician Assistant

## 2018-12-10 ENCOUNTER — Ambulatory Visit: Payer: Self-pay | Admitting: Physician Assistant

## 2018-12-11 ENCOUNTER — Encounter: Payer: Self-pay | Admitting: Physician Assistant

## 2018-12-11 ENCOUNTER — Ambulatory Visit (INDEPENDENT_AMBULATORY_CARE_PROVIDER_SITE_OTHER): Payer: 59 | Admitting: Physician Assistant

## 2018-12-11 ENCOUNTER — Other Ambulatory Visit: Payer: Self-pay

## 2018-12-11 DIAGNOSIS — F39 Unspecified mood [affective] disorder: Secondary | ICD-10-CM | POA: Diagnosis not present

## 2018-12-11 DIAGNOSIS — Z79899 Other long term (current) drug therapy: Secondary | ICD-10-CM

## 2018-12-11 DIAGNOSIS — G4700 Insomnia, unspecified: Secondary | ICD-10-CM | POA: Diagnosis not present

## 2018-12-11 MED ORDER — CARBAMAZEPINE 200 MG PO TABS: 200.0000 mg | ORAL_TABLET | Freq: Three times a day (TID) | ORAL | 1 refills | Status: DC

## 2018-12-11 NOTE — Progress Notes (Signed)
[facility]  Patient ID: Trevor Yoder,  MRN: [id number]  PCP: London Pepper, MD  Date of Evaluation: 12/11/2018 Time spent:15 minutes  Chief Complaint:  Chief Complaint    Stress      HISTORY/CURRENT STATUS: HPI Increased stress.  Stress has increased.  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  "This job is killing me.  I'm looking for a new job.  But in the meantime, I have to put up w/ what I've got.  I'm more irritable and yelled at my neighbor not long ago over nothing.  My patience is very thin.  My wife understands but she gets the brunt of it a lot.  I'm drinking more, about 3-4 beers some nights.  It helps relax me but I know it's not good.  My wife wants me to see a Social worker.  I'm not sure if it'll do much good.  I know I need to get out of the situation.  That's what I need."  Patient denies increased energy with decreased need for sleep, no increased talkativeness, no racing thoughts, no impulsivity or risky behaviors, no increased spending, no increased libido, no grandiosity.  Denies hallucinations.  Patient denies loss of interest in usual activities and is able to enjoy things.  Denies decreased energy or motivation.  Appetite has not changed.  No extreme sadness, tearfulness, or feelings of hopelessness.  Denies any changes in concentration, making decisions or remembering things.  Denies suicidal or homicidal thoughts.  Denies dizziness, syncope, seizures, numbness, tingling, tremor, tics, unsteady gait, slurred speech, confusion. Denies muscle or joint pain, stiffness, or dystonia.  Individual Medical History/ Review of Systems: Changes? :No    Past medications for mental health diagnoses include: Equetro, trazodone  Allergies: Patient has no known allergies.  Current Medications:  Current Outpatient Medications:  .  traZODone (DESYREL) 100 MG tablet, Take 1-2 Tablets by mouth at Bedtime as needed for sleep., Disp: 180  tablet, Rfl: 2 .  carbamazepine (TEGRETOL) 200 MG tablet, Take 1 tablet (200 mg total) by mouth 3 (three) times daily., Disp: 90 tablet, Rfl: 1 .  oxyCODONE-acetaminophen (PERCOCET/ROXICET) 5-325 MG tablet, TAKE 1-2 TABLETS BY MOUTH EVERY 4-6HRS AS NEEDED FOR PAIN, Disp: , Rfl: 0 .  promethazine (PHENERGAN) 25 MG tablet, TAKE 1 TABLET BY MOUTH EVERY 8 HOURS AS NEEDED FOR A NAUSEA / VOMITING, Disp: , Rfl: 0 Medication Side Effects: none  Family Medical/ Social History: Changes? No  MENTAL HEALTH EXAM:  There were no vitals taken for this visit.There is no height or weight on file to calculate BMI.  General Appearance: Casual  Eye Contact:  Good  Speech:  Clear and Coherent  Volume:  Normal  Mood:  Irritable  Affect:  Appropriate  Thought Process:  Goal Directed  Orientation:  Full (Time, Place, and Person)  Thought Content: Logical   Suicidal Thoughts:  No  Homicidal Thoughts:  No  Memory:  WNL  Judgement:  Good  Insight:  Good  Psychomotor Activity:  Fidgety  Concentration:  Concentration: Good  Recall:  Good  Fund of Knowledge: Good  Language: Good  Assets:  Desire for Improvement  ADL's:  Intact  Cognition: WNL  Prognosis:  Good    DIAGNOSES:    ICD-10-CM   1. Episodic mood disorder (HCC)  F39   2. Encounter for long-term (current) use of medications  Z79.899 Carbamazepine level, total  3. Insomnia, unspecified type  G47.00     Receiving Psychotherapy:  No    RECOMMENDATIONS:  Increase Tegretol to 200mg  1 tid (or 1 q am, 2 qhs.) Continue trazodone 100 mg 1-2 p.o. nightly as needed sleep.  Discussed sleep hygiene. Refer to Dr. Luan Moore Return in 6 weeks.  Donnal Moat, PA-C   This record has been created using [employer].  Chart creation errors have been sought, but may not always have been located and corrected. Such creation errors do not reflect on the standard of medical care.

## 2018-12-18 ENCOUNTER — Ambulatory Visit (INDEPENDENT_AMBULATORY_CARE_PROVIDER_SITE_OTHER): Payer: 59 | Admitting: Psychiatry

## 2018-12-18 ENCOUNTER — Other Ambulatory Visit: Payer: Self-pay

## 2018-12-18 DIAGNOSIS — F411 Generalized anxiety disorder: Secondary | ICD-10-CM

## 2018-12-18 DIAGNOSIS — F39 Unspecified mood [affective] disorder: Secondary | ICD-10-CM | POA: Diagnosis not present

## 2018-12-18 DIAGNOSIS — Z566 Other physical and mental strain related to work: Secondary | ICD-10-CM | POA: Diagnosis not present

## 2018-12-18 DIAGNOSIS — G4700 Insomnia, unspecified: Secondary | ICD-10-CM | POA: Diagnosis not present

## 2018-12-18 NOTE — Progress Notes (Signed)
PROBLEM-FOCUSED INITIAL PSYCHOTHERAPY EVALUATION Luan Moore, PhD LP [facility]  Name: Trevor Yoder Date: 12/18/2018 Time spent: 55 min MRN: [id number] DOB: 1966-01-12 Guardian/Payee: self  PCP: London Pepper, MD Documentation requested on this visit: No  PROBLEM HISTORY Reason for Visit /Presenting Problem:  Chief Complaint  Patient presents with  . Establish Care  . Stress    Narrative/History of Present Illness Referred by Donnal Moat, PA for stress management.  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  Whole department is miserable, under a supervisor who is very unrealistic.  Gearing up to look for another job, but feels trapped about 10 years in this industry.  Happened with last recession and employer treating people more as disposable.  Sees 7th, maybe 8th time this is happening in his career.  Stressed also by the process of software development as it has come to be, and the unrealistic intricacies of assembly-line development and the load of communication required to coordinate teams.   At home, has started a kitchen renovation, needs time to do some of the work himself.  Hopes to move into either brewing [employer], in [place] or woodworking (entrepreneurial) if feasible.  Feels pressure of breadwinner status, though wife Trevor Yoder is very supportive and also working.    Has carried a Bipolar II diagnosis.  Noted having had varied interests that can sometimes fade easily.  Can get irritable with youngest son.  Decade ago had an irritable episode with coworkers that led to confrontation.  Not much in the way of serious depressive episodes.  Has maintained mood stabilizer   Sleep quality not good for a while.  Trouble shutting off brain.  Trazodone helps some, but last few years fair results.  More lately a lot of MNA c. 3am.  2007 woke up bolt upright at 4am, always been 5am or  earlier since.  Coffee 2/d, will have migraine if comes off.    Prior Psychiatric Assessment/Treatment:   Outpatient treatment history: Paper chart shows OP psychiatry in this office since late 2012, with Dustin Flock, MD then Donnal Moat, PA-C.  On annual med checks at this point, just seen for renewal of carbamazepine last week, renewed and increased anticonvulsant with plan to review 6 wks.  2 sessions therapy with me in 2013 for stress management, with identified alcohol tolerance, career change, and recommendation for carb control to reduce physiological triggers to irritability. History of psychiatric hospitalization: none stated History of psychological assessment/testing: none stated   Abuse History: Victim of abuse: Not assessed at this time / none suspected.   Victim of neglect: Not assessed at this time / none suspected.   Perpetrator of abuse/neglect: No.   Witness / Exposure to Domestic Violence: No.   Witness to [employer] Violence:  No.   Protective Services Involvement: No.   Report needed: No.    Substance Abuse History: Current substance abuse: Not assessed at this time / none suspected.  Though EHR notes est 15 drinks/wk. History of impactful substance use/abuse: Yes.  EHR shows former smoker, quit 2010.  Paper record 2013 includes suspected alcohol tolerance at the time.   FAMILY/SOCIAL HISTORY Family of origin -- Upbringing -- all intelligent kids, mother expected much.  Mother was also alcoholic and bipolar, fought with stepfather a lot, one brother got into drugs badly.  Mother alive, but not speaking 10 years after a major blowout between them.  Current family -- Wife Trevor Yoder.  Stable, supportive, 2-income household.  Youngest son at home Education -- college graduate   Finances --  Stably employed, entertaining career change and entrepreneurship History of [employer] -- none stated.   Spiritually -- none stated Enjoyable activities -- brewing,  woodworking  Other situational factors affecting treatment and prognosis: Stressors from the following areas: Occupational concerns Barriers to service: none stated  Notable cultural sensitivities: none stated Strengths: Family   MED/SURG HISTORY Med/surg history was not reviewed with PT at this time. Past Medical History:  Diagnosis Date  . Mood disorder [facility])      Past Surgical History:  Procedure Laterality Date  . CERVICAL DISCECTOMY  2011    No Known Allergies  Medications (as listed in Epic): Current Outpatient Medications  Medication Sig Dispense Refill  . carbamazepine (TEGRETOL) 200 MG tablet Take 1 tablet (200 mg total) by mouth 3 (three) times daily. 90 tablet 1  . oxyCODONE-acetaminophen (PERCOCET/ROXICET) 5-325 MG tablet TAKE 1-2 TABLETS BY MOUTH EVERY 4-6HRS AS NEEDED FOR PAIN  0  . promethazine (PHENERGAN) 25 MG tablet TAKE 1 TABLET BY MOUTH EVERY 8 HOURS AS NEEDED FOR A NAUSEA / VOMITING  0  . traZODone (DESYREL) 100 MG tablet TAKE 1 TO 2 TABLETS BY MOUTH AT BEDTIME AS NEEDED FOR SLEEP 180 tablet 0   No current facility-administered medications for this visit.     MENTAL STATUS AND OBSERVATIONS Appearance:   Casual     Behavior:  Appropriate and possibly mildly guarded, but forthcoming and responsive to humor   Motor:  Normal  Speech/Language:   Clear and Coherent  Affect:  Appropriate  Mood:  tense  Thought process:  normal  Thought content:    WNL  Sensory/Perceptual disturbances:    WNL  Orientation:  grossly intact  Attention:  Good  Concentration:  Good  Memory:  WNL  Fund of knowledge:   Good  Insight:    Good  Judgment:   Good  Impulse Control:  Good  Initial Risk Assessment: Danger to Self: No Self-injurious Behavior: No Danger to Others: No Physical Aggression / Violence: No Duty to Warn: No Access to Firearms a concern: No Gang Involvement: No Patient / guardian was educated about steps to take if suicide or homicide risk level  increases between visits: yes . While future psychiatric events cannot be accurately predicted, the patient does not currently require acute inpatient psychiatric care and does not currently meet [facility] involuntary commitment criteria.   DIAGNOSIS:    ICD-10-CM   1. Episodic mood disorder (HCC)  F39    hx dx BAD-II  2. Insomnia, unspecified type  G47.00   3. Generalized anxiety disorder  F41.1   4. Stressful workplace  Z56.6     INITIAL TREATMENT: . Ethical orientation and verbal consents to o privacy rights including, but not limited to, HIPAA provisions and any questions, EMR and use of e-PHI o patient responsibilities, including scheduling and fair notice of changes, method of visit options and regulatory and financial conditions affecting them o expectations for working relationship in psychotherapy o expectations and consents for working partnerships with other health care disciplines, especially including medication and other behavioral health providers . Support/validation for stressors and symptoms . Confirmed rapport . Affirmed choice in reorienting his career and work, encouraged maintain openness with wife as he weighs options . Reviewed sleep as physical foundation for stress tolerance, factors affecting it.  Oriented to circadian rhythm findings and the benefits  of blue light timing, especially for a Product manager, and robust role in helping mood disorders.  Offered information on methods. . Discussed outlook for therapy -- not clear he wants to pursue at this time  Plan: . Check out blue-filtering methods for evening to improve sleep readiness and quality, quality light early in the day  . Consider motivation for therapy and reply . Maintain medication as prescribed and work faithfully with relevant prescriber(s) if any changes are desired or seem indicated . Call the clinic on-call service, present to ER, or call 911 if any life-threatening psychiatric  crisis Return at discretion.  Blanchie Serve, PhD  Luan Moore, PhD LP Clinical Psychologist, [facility] [address] [place], [place] [address] [phone]

## 2019-01-21 ENCOUNTER — Ambulatory Visit: Payer: 59 | Admitting: Physician Assistant

## 2019-01-28 ENCOUNTER — Other Ambulatory Visit: Payer: Self-pay | Admitting: Physician Assistant

## 2019-02-25 ENCOUNTER — Ambulatory Visit: Payer: 59 | Admitting: Physician Assistant

## 2019-03-13 IMAGING — MG DIGITAL DIAGNOSTIC BILATERAL MAMMOGRAM WITH TOMO AND CAD
8 series · 9 of 28 positions shown · non-contrast
Comparison: None.

CLINICAL DATA: 52-year-old male complaining of left subareolar
tenderness.

EXAM:
DIGITAL DIAGNOSTIC BILATERAL MAMMOGRAM WITH CAD AND TOMO

[L MLO synth-2D]
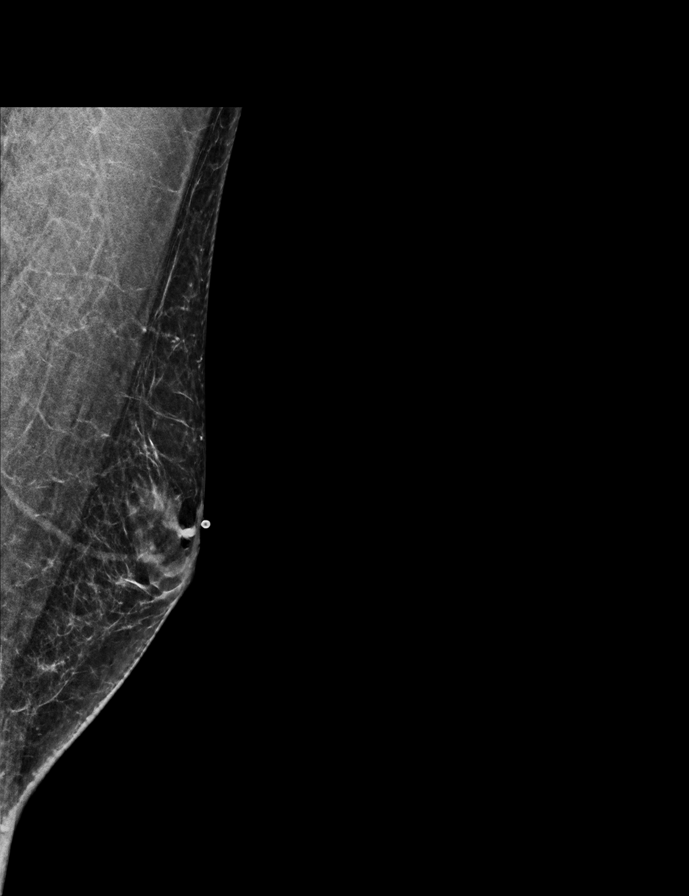

[L TAN synth-2D]
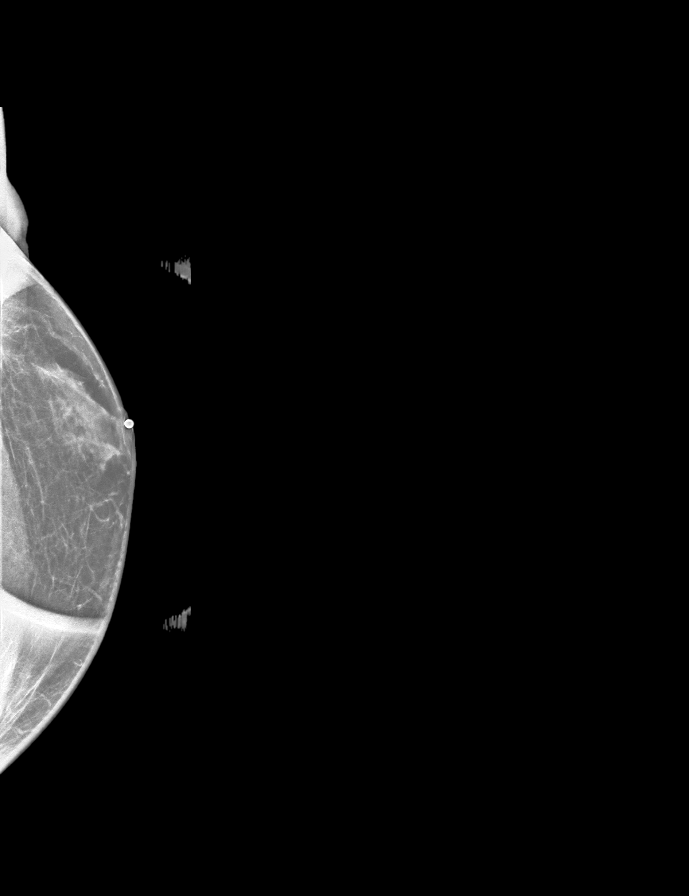

[R CC synth-2D]
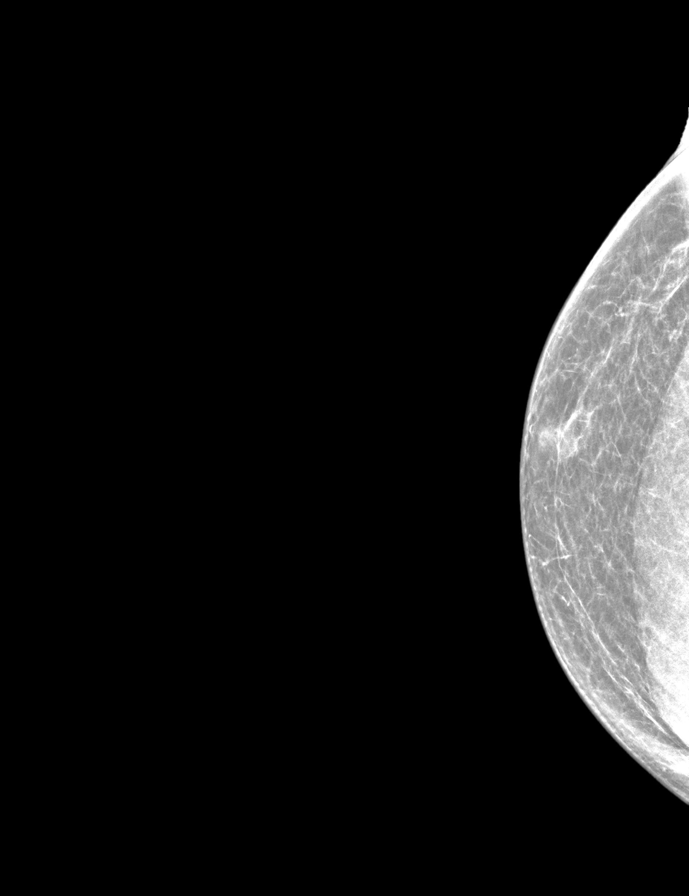

[R MLO tomo · 2 of 58 frames shown]
[frame 19/58]
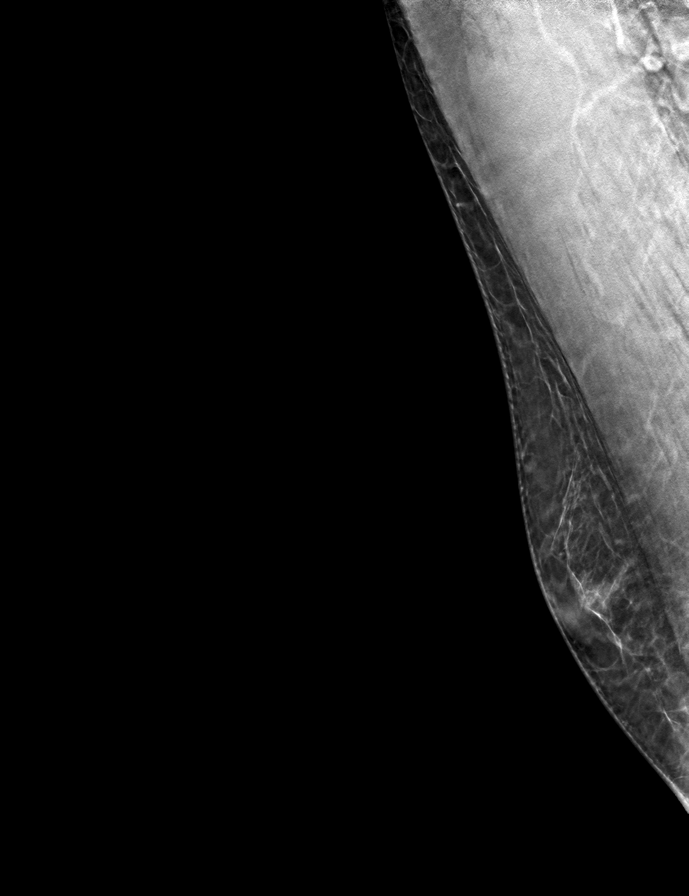
[frame 29/58]
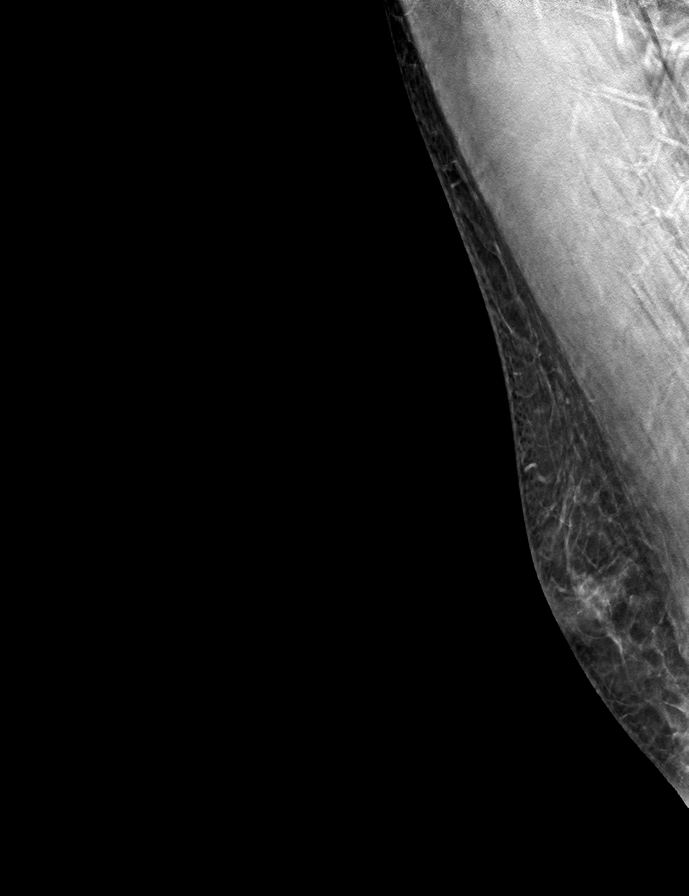

[L TAN tomo · tomo slice 26/51.0]
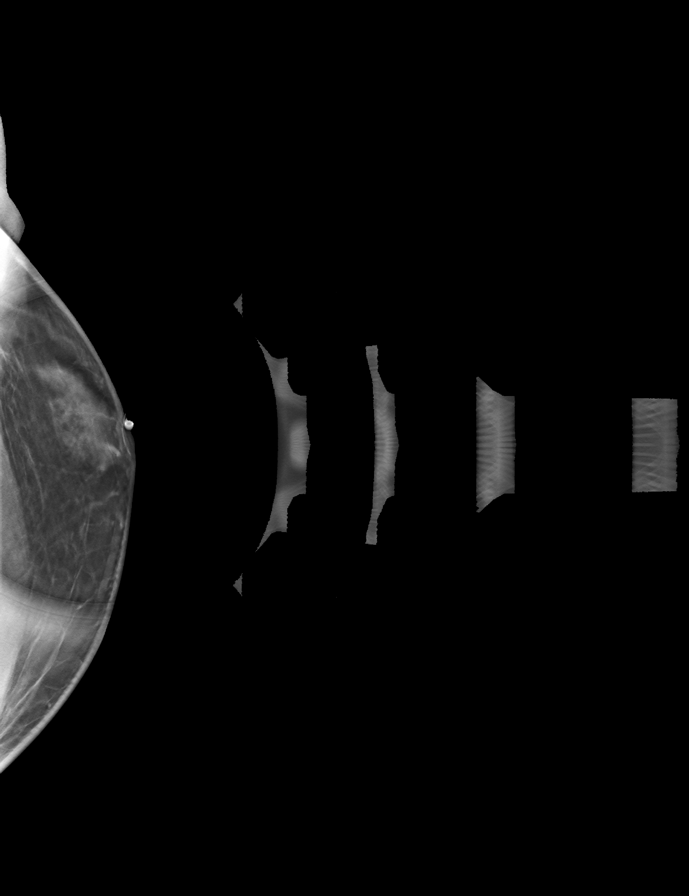

[R CC tomo · tomo slice 24/47.0]
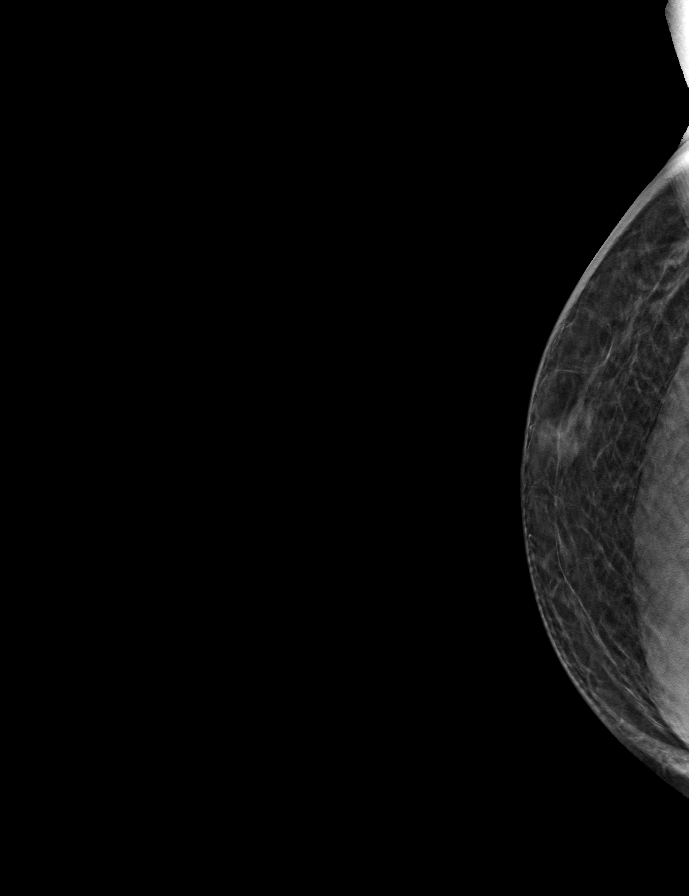

[L CC tomo · tomo slice 27/54.0]
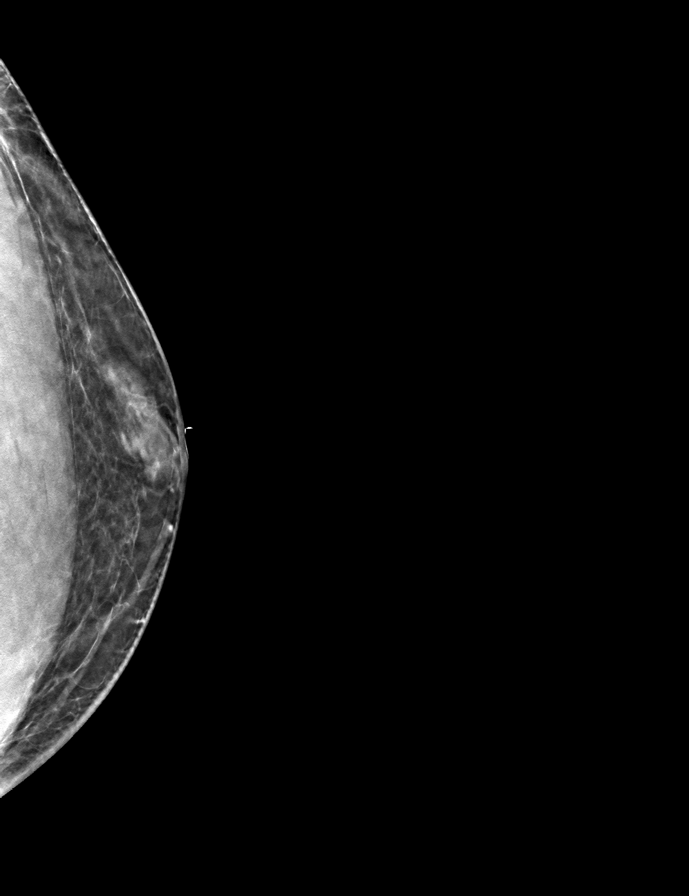

[L MLO tomo · tomo slice 27/52.0]
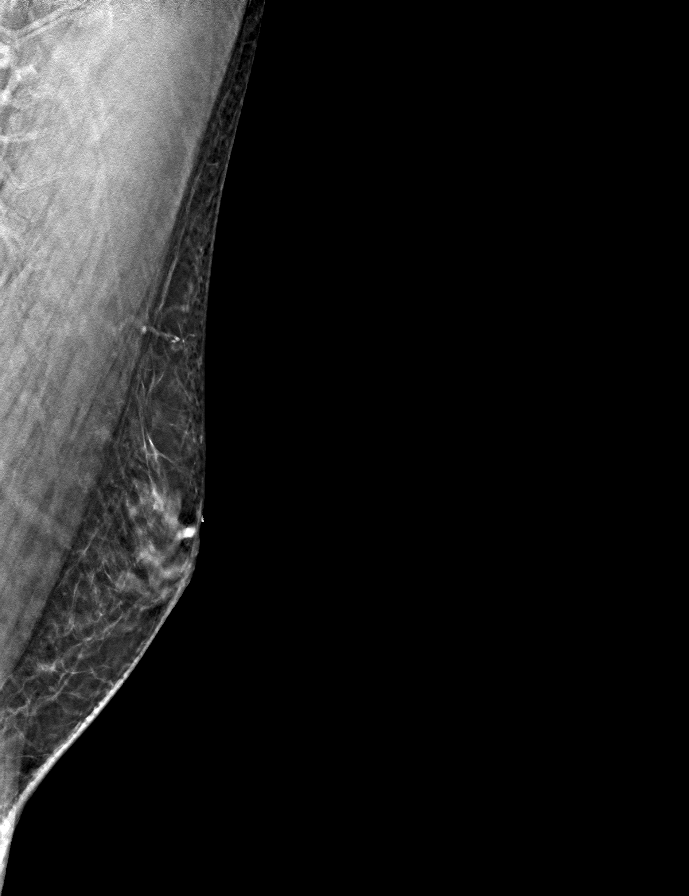

[9 of 28 positions shown; findings below may reference images not displayed]

ACR Breast Density Category b: There are scattered areas of
fibroglandular density.
FINDINGS: Flame shaped fibroglandular tissue is seen in the subareolar regions
of both breast with interspersed fat. This is more prominent in the
left breast and is consistent with gynecomastia. There is no
suspicious mass or malignant type microcalcifications.

Mammographic images were processed with CAD.
IMPRESSION: Bilateral gynecomastia that is more prominent in the left breast. No
evidence of malignancy in either breast.

RECOMMENDATION:
If the clinical exam remains benign/stable no follow-up is needed.

I have discussed the findings and recommendations with the patient.
Results were also provided in writing at the conclusion of the
visit. If applicable, a reminder letter will be sent to the patient
regarding the next appointment.

BI-RADS CATEGORY  2: Benign.

## 2019-04-05 ENCOUNTER — Other Ambulatory Visit: Payer: Self-pay | Admitting: Physician Assistant

## 2019-04-19 ENCOUNTER — Ambulatory Visit: Payer: 59 | Admitting: Physician Assistant

## 2019-05-19 ENCOUNTER — Ambulatory Visit: Payer: Self-pay | Admitting: Physician Assistant

## 2019-05-22 ENCOUNTER — Other Ambulatory Visit: Payer: Self-pay | Admitting: Physician Assistant

## 2019-06-07 ENCOUNTER — Ambulatory Visit: Payer: 59 | Admitting: Physician Assistant

## 2019-06-17 ENCOUNTER — Other Ambulatory Visit: Payer: Self-pay | Admitting: Physician Assistant

## 2019-07-01 ENCOUNTER — Encounter: Payer: Self-pay | Admitting: Physician Assistant

## 2019-07-01 ENCOUNTER — Ambulatory Visit (INDEPENDENT_AMBULATORY_CARE_PROVIDER_SITE_OTHER): Payer: BC Managed Care – PPO | Admitting: Physician Assistant

## 2019-07-01 DIAGNOSIS — F411 Generalized anxiety disorder: Secondary | ICD-10-CM

## 2019-07-01 DIAGNOSIS — F39 Unspecified mood [affective] disorder: Secondary | ICD-10-CM

## 2019-07-01 DIAGNOSIS — Z79899 Other long term (current) drug therapy: Secondary | ICD-10-CM | POA: Diagnosis not present

## 2019-07-01 DIAGNOSIS — G4700 Insomnia, unspecified: Secondary | ICD-10-CM

## 2019-07-01 MED ORDER — CARBAMAZEPINE 200 MG PO TABS: 200.0000 mg | ORAL_TABLET | Freq: Two times a day (BID) | ORAL | 1 refills | Status: DC

## 2019-07-01 MED ORDER — TRAZODONE HCL 100 MG PO TABS: 100.0000 mg | ORAL_TABLET | Freq: Every evening | ORAL | 1 refills | Status: DC | PRN

## 2019-07-01 NOTE — Progress Notes (Signed)
[facility]  Patient ID: Trevor Yoder,  MRN: [id number]  PCP: London Pepper, MD  Date of Evaluation: 07/01/2019 Time spent:20 minutes  Chief Complaint:  Chief Complaint    Anxiety; Depression; Follow-up     Virtual Visit via Telephone Note  I connected with patient by a video enabled telemedicine application or telephone, with their informed consent, and verified patient privacy and that I am speaking with the correct person using two identifiers.  I am private, in my office and the patient is home.  I discussed the limitations, risks, security and privacy concerns of performing an evaluation and management service by telephone and the availability of in person appointments. I also discussed with the patient that there may be a patient responsible charge related to this service. The patient expressed understanding and agreed to proceed.   I discussed the assessment and treatment plan with the patient. The patient was provided an opportunity to ask questions and all were answered. The patient agreed with the plan and demonstrated an understanding of the instructions.   The patient was advised to call back or seek an in-person evaluation if the symptoms worsen or if the condition fails to improve as anticipated.  I provided 20 minutes of non-face-to-face time during this encounter.  HISTORY/CURRENT STATUS: HPI For routine med check.  Trevor Yoder states he is doing pretty well right now.  He lost his job that was causing so much stress so he decreased the carbamazepine back down to twice daily from 3 times daily.  Once his stress was decreased, he did not feel the need to take the higher dose.  He got another job but that did not work out either and he had been out of work for about 6 weeks until he got a new job starting today.  He thinks he will like it and things are going well.  He is able to enjoy things.  He is trying to always see that stress is a choice.  Energy and  motivation are good.  He does not cry easily.  Denies suicidal or homicidal thoughts.  He sleeps okay most of the time.  He has had to increase the trazodone up to 200 mg to be beneficial.  Even then, occasionally he will have a night where he wakes up at 3 in the morning and cannot go back to sleep.  States it is not really a problem at this point.  Patient denies increased energy with decreased need for sleep, no increased talkativeness, no racing thoughts, no impulsivity or risky behaviors, no increased spending, no increased libido, no grandiosity.  Denies dizziness, syncope, seizures, numbness, tingling, tremor, tics, unsteady gait, slurred speech, confusion. Denies muscle or joint pain, stiffness, or dystonia.  Individual Medical History/ Review of Systems: Changes? :No    Past medications for mental health diagnoses include: Equetro, trazodone  Allergies: Patient has no known allergies.  Current Medications:  Current Outpatient Medications:  .  carbamazepine (TEGRETOL) 200 MG tablet, Take 1 tablet (200 mg total) by mouth 2 (two) times daily., Disp: 180 tablet, Rfl: 1 .  traZODone (DESYREL) 100 MG tablet, Take 1-2 tablets (100-200 mg total) by mouth at bedtime as needed. for sleep, Disp: 180 tablet, Rfl: 1 .  oxyCODONE-acetaminophen (PERCOCET/ROXICET) 5-325 MG tablet, TAKE 1-2 TABLETS BY MOUTH EVERY 4-6HRS AS NEEDED FOR PAIN, Disp: , Rfl: 0 .  promethazine (PHENERGAN) 25 MG tablet, TAKE 1 TABLET BY MOUTH EVERY 8 HOURS AS NEEDED FOR A NAUSEA / VOMITING, Disp: ,  Rfl: 0 Medication Side Effects: none  Family Medical/ Social History: Changes? Yes, new job starting today, [employer].  MENTAL HEALTH EXAM:  There were no vitals taken for this visit.There is no height or weight on file to calculate BMI.  General Appearance: unable to assess  Eye Contact:  unable to assess  Speech:  Clear and Coherent  Volume:  Normal  Mood:  Euthymic  Affect:  unable to assess  Thought  Process:  Goal Directed and Descriptions of Associations: Intact  Orientation:  Full (Time, Place, and Person)  Thought Content: Logical   Suicidal Thoughts:  No  Homicidal Thoughts:  No  Memory:  WNL  Judgement:  Good  Insight:  Good  Psychomotor Activity:  unable to assess  Concentration:  Concentration: Good  Recall:  Good  Fund of Knowledge: Good  Language: Good  Assets:  Desire for Improvement  ADL's:  Intact  Cognition: WNL  Prognosis:  Good    DIAGNOSES:    ICD-10-CM   1. Episodic mood disorder (HCC)  F39   2. Encounter for long-term (current) use of medications  Z79.899 Carbamazepine level, total    Comprehensive metabolic panel    CBC with Differential/Platelet  3. Insomnia, unspecified type  G47.00   4. Generalized anxiety disorder  F41.1     Receiving Psychotherapy: No    RECOMMENDATIONS:  PDMP was reviewed. I spent 20 minutes with him. I am glad to see he is doing so much better. Continue carbamazepine 200 mg, 1 p.o. twice daily. Continue trazodone 100 mg, 1-2 nightly as needed sleep. Draw labs as noted above sometime within the next few months. Sleep hygiene discussed. Return in 6 months.  Donnal Moat, PA-C

## 2019-07-09 ENCOUNTER — Other Ambulatory Visit: Payer: Self-pay | Admitting: Physician Assistant

## 2019-07-09 NOTE — Telephone Encounter (Signed)
Submitted last week for 90 day

## 2019-12-21 ENCOUNTER — Other Ambulatory Visit: Payer: Self-pay | Admitting: Physician Assistant

## 2019-12-31 ENCOUNTER — Ambulatory Visit: Payer: BC Managed Care – PPO | Admitting: Physician Assistant

## 2020-01-27 ENCOUNTER — Telehealth: Payer: Self-pay | Admitting: Physician Assistant

## 2020-01-27 ENCOUNTER — Encounter: Payer: Self-pay | Admitting: Physician Assistant

## 2020-01-27 ENCOUNTER — Telehealth (INDEPENDENT_AMBULATORY_CARE_PROVIDER_SITE_OTHER): Payer: BC Managed Care – PPO | Admitting: Physician Assistant

## 2020-01-27 DIAGNOSIS — Z79899 Other long term (current) drug therapy: Secondary | ICD-10-CM | POA: Diagnosis not present

## 2020-01-27 DIAGNOSIS — G4700 Insomnia, unspecified: Secondary | ICD-10-CM

## 2020-01-27 DIAGNOSIS — F39 Unspecified mood [affective] disorder: Secondary | ICD-10-CM

## 2020-01-27 MED ORDER — CARBAMAZEPINE 200 MG PO TABS: 200.0000 mg | ORAL_TABLET | Freq: Two times a day (BID) | ORAL | 1 refills | Status: DC

## 2020-01-27 MED ORDER — TRAZODONE HCL 100 MG PO TABS
100.0000 mg | ORAL_TABLET | Freq: Every evening | ORAL | 1 refills | Status: DC | PRN
Start: 2020-01-27 — End: 2020-06-16

## 2020-01-27 NOTE — Progress Notes (Signed)
[facility]  Patient ID: Trevor Yoder,  MRN: [id number]  PCP: London Pepper, MD  Date of Evaluation: 01/27/2020 Time spent:20 minutes  Chief Complaint:  Chief Complaint    Medication Refill     Virtual Visit via Telehealth  I connected with patient by telephone, with their informed consent, and verified patient privacy and that I am speaking with the correct person using two identifiers.  I am private, in my office and the patient is at home.  I discussed the limitations, risks, security and privacy concerns of performing an evaluation and management service by telephone and the availability of in person appointments. I also discussed with the patient that there may be a patient responsible charge related to this service. The patient expressed understanding and agreed to proceed.   I discussed the assessment and treatment plan with the patient. The patient was provided an opportunity to ask questions and all were answered. The patient agreed with the plan and demonstrated an understanding of the instructions.   The patient was advised to call back or seek an in-person evaluation if the symptoms worsen or if the condition fails to improve as anticipated.  I provided 20 minutes of non-face-to-face time during this encounter.   HISTORY/CURRENT STATUS: HPI For routine med check.  Is doing really well.  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  He is able to enjoy things.  He is trying to always see that stress is a choice. Energy and motivation are good.  He does not cry easily.  Not anxious most of the time.  Denies suicidal or homicidal thoughts.  Patient denies increased energy with decreased need for sleep, no increased talkativeness, no racing thoughts, no impulsivity or risky behaviors, no increased spending, no increased libido, no  irritability, no paranoia, no grandiosity.  Denies dizziness, syncope, seizures, numbness, tingling, tremor, tics, unsteady gait, slurred speech, confusion. Denies muscle or joint pain, stiffness, or dystonia.  Individual Medical History/ Review of Systems: Changes? :No    Past medications for mental health diagnoses include: Equetro, trazodone  Allergies: Patient has no known allergies.  Current Medications:  Current Outpatient Medications:  .  carbamazepine (TEGRETOL) 200 MG tablet, Take 1 tablet (200 mg total) by mouth 2 (two) times daily., Disp: 180 tablet, Rfl: 1 .  traZODone (DESYREL) 100 MG tablet, Take 1-2 tablets (100-200 mg total) by mouth at bedtime as needed. for sleep, Disp: 180 tablet, Rfl: 1 .  oxyCODONE-acetaminophen (PERCOCET/ROXICET) 5-325 MG tablet, TAKE 1-2 TABLETS BY MOUTH EVERY 4-6HRS AS NEEDED FOR PAIN, Disp: , Rfl: 0 .  promethazine (PHENERGAN) 25 MG tablet, TAKE 1 TABLET BY MOUTH EVERY 8 HOURS AS NEEDED FOR A NAUSEA / VOMITING, Disp: , Rfl: 0 Medication Side Effects: none  Family Medical/ Social History: Changes? 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 So far, he's liking it.   MENTAL HEALTH EXAM:  There were no vitals taken for this visit.There is no height or weight on file to calculate BMI.  General Appearance: unable to assess  Eye Contact:  unable to assess  Speech:  Clear and Coherent and Normal Rate  Volume:  Normal  Mood:  Euthymic  Affect:  unable to assess  Thought Process:  Goal Directed and Descriptions of Associations: Intact  Orientation:  Full (Time,  Place, and Person)  Thought Content: Logical   Suicidal Thoughts:  No  Homicidal Thoughts:  No  Memory:  WNL  Judgement:  Good  Insight:  Good  Psychomotor Activity:  unable to assess  Concentration:  Concentration: Good and Attention Span: Good  Recall:  Good  Fund of Knowledge: Good  Language: Good  Assets:  Desire for Improvement  ADL's:  Intact   Cognition: WNL  Prognosis:  Good    DIAGNOSES:    ICD-10-CM   1. Episodic mood disorder (HCC)  F39   2. Encounter for long-term (current) use of medications  Z79.899 CBC with Differential/Platelet    Comprehensive metabolic panel    Carbamazepine level, total  3. Insomnia, unspecified type  G47.00     Receiving Psychotherapy: No    RECOMMENDATIONS:  PDMP was reviewed. I provided 20 minutes of non face to face time during this encounter.  Continue carbamazepine 200 mg, 1 p.o. twice daily. Continue trazodone 100 mg, 1-2 nightly as needed sleep. Draw labs as noted above sometime within the next few months.  Stressed importance. Return in 6 months.  Donnal Moat, PA-C

## 2020-01-27 NOTE — Telephone Encounter (Signed)
[name], meinders are scheduled for a virtual visit with your provider today.    Just as we do with appointments in the office, we must obtain your consent to participate.  Your consent will be active for this visit and any virtual visit you may have with one of our providers in the next 365 days.    If you have a MyChart account, I can also send a copy of this consent to you electronically.  All virtual visits are billed to your insurance company just like a traditional visit in the office.  As this is a virtual visit, video technology does not allow for your provider to perform a traditional examination.  This may limit your provider's ability to fully assess your condition.  If your provider identifies any concerns that need to be evaluated in person or the need to arrange testing such as labs, EKG, etc, we will make arrangements to do so.    Although advances in technology are sophisticated, we cannot ensure that it will always work on either your end or our end.  If the connection with a video visit is poor, we may have to switch to a telephone visit.  With either a video or telephone visit, we are not always able to ensure that we have a secure connection.   I need to obtain your verbal consent now.   Are you willing to proceed with your visit today?   Trevor Yoder has provided verbal consent on 01/27/2020 for a virtual visit (video or telephone).   Donnal Moat, PA-C 01/27/2020  9:27 AM

## 2020-06-16 ENCOUNTER — Other Ambulatory Visit: Payer: Self-pay | Admitting: Physician Assistant

## 2020-06-16 ENCOUNTER — Telehealth: Payer: Self-pay | Admitting: Physician Assistant

## 2020-06-16 MED ORDER — TRAZODONE HCL 100 MG PO TABS: 100.0000 mg | ORAL_TABLET | Freq: Every evening | ORAL | 1 refills | Status: DC | PRN

## 2020-06-16 NOTE — Telephone Encounter (Signed)
Geovannie called he needs refill of Trazadone.  He has moved so has new pharmacy. [facility] on [address] in [place].  appt 07/28/20.  It is out and wants to get it before the storm gets here is possible.

## 2020-06-16 NOTE — Telephone Encounter (Signed)
Prescription was sent

## 2020-07-18 ENCOUNTER — Other Ambulatory Visit: Payer: Self-pay | Admitting: Physician Assistant

## 2020-07-20 LAB — COMPREHENSIVE METABOLIC PANEL
ALT: 20 IU/L (ref 0–44)
AST: 18 IU/L (ref 0–40)
Albumin/Globulin Ratio: 2.5 — ABNORMAL HIGH (ref 1.2–2.2)
Albumin: 4.7 g/dL (ref 3.8–4.9)
Alkaline Phosphatase: 48 IU/L (ref 44–121)
BUN/Creatinine Ratio: 15 (ref 9–20)
BUN: 15 mg/dL (ref 6–24)
Bilirubin Total: 0.3 mg/dL (ref 0.0–1.2)
CO2: 24 mmol/L (ref 20–29)
Calcium: 9 mg/dL (ref 8.7–10.2)
Chloride: 96 mmol/L (ref 96–106)
Creatinine, Ser: 1.02 mg/dL (ref 0.76–1.27)
GFR calc Af Amer: 97 mL/min/{1.73_m2} (ref >59)
GFR calc non Af Amer: 84 mL/min/{1.73_m2} (ref >59)
Globulin, Total: 1.9 g/dL (ref 1.5–4.5)
Glucose: 101 mg/dL — ABNORMAL HIGH (ref 65–99)
Potassium: 4.5 mmol/L (ref 3.5–5.2)
Sodium: 137 mmol/L (ref 134–144)
Total Protein: 6.6 g/dL (ref 6.0–8.5)

## 2020-07-20 LAB — CBC WITH DIFFERENTIAL/PLATELET
Basophils Absolute: 0 10*3/uL (ref 0.0–0.2)
Basos: 1 %
EOS (ABSOLUTE): 0.1 10*3/uL (ref 0.0–0.4)
Eos: 1 %
Hematocrit: 41.7 % (ref 37.5–51.0)
Hemoglobin: 14.7 g/dL (ref 13.0–17.7)
Immature Grans (Abs): 0 10*3/uL (ref 0.0–0.1)
Immature Granulocytes: 0 %
Lymphocytes Absolute: 1.1 10*3/uL (ref 0.7–3.1)
Lymphs: 18 %
MCH: 33.1 pg — ABNORMAL HIGH (ref 26.6–33.0)
MCHC: 35.3 g/dL (ref 31.5–35.7)
MCV: 94 fL (ref 79–97)
Monocytes Absolute: 0.5 10*3/uL (ref 0.1–0.9)
Monocytes: 9 %
Neutrophils Absolute: 4.3 10*3/uL (ref 1.4–7.0)
Neutrophils: 71 %
Platelets: 209 10*3/uL (ref 150–450)
RBC: 4.44 x10E6/uL (ref 4.14–5.80)
RDW: 12 % (ref 11.6–15.4)
WBC: 6 10*3/uL (ref 3.4–10.8)

## 2020-07-20 LAB — CARBAMAZEPINE LEVEL, TOTAL: Carbamazepine (Tegretol), S: 5.1 ug/mL (ref 4.0–12.0)

## 2020-07-20 NOTE — Progress Notes (Signed)
Please let him know labs are fine, CBC is normal, CMP all nl, Tegretol level is 5.1 which is good. No change in meds

## 2020-07-21 NOTE — Progress Notes (Signed)
Pt has been called and informed.  

## 2020-07-28 ENCOUNTER — Encounter: Payer: Self-pay | Admitting: Physician Assistant

## 2020-07-28 ENCOUNTER — Other Ambulatory Visit: Payer: Self-pay

## 2020-07-28 ENCOUNTER — Ambulatory Visit (INDEPENDENT_AMBULATORY_CARE_PROVIDER_SITE_OTHER): Payer: BC Managed Care – PPO | Admitting: Physician Assistant

## 2020-07-28 DIAGNOSIS — F39 Unspecified mood [affective] disorder: Secondary | ICD-10-CM

## 2020-07-28 DIAGNOSIS — G4700 Insomnia, unspecified: Secondary | ICD-10-CM

## 2020-07-28 DIAGNOSIS — F411 Generalized anxiety disorder: Secondary | ICD-10-CM | POA: Diagnosis not present

## 2020-07-28 MED ORDER — ZALEPLON 10 MG PO CAPS: ORAL_CAPSULE | ORAL | 1 refills | Status: DC

## 2020-07-28 MED ORDER — CARBAMAZEPINE 200 MG PO TABS: 200.0000 mg | ORAL_TABLET | Freq: Two times a day (BID) | ORAL | 1 refills | Status: DC

## 2020-07-28 NOTE — Progress Notes (Signed)
[facility]  Patient ID: Trevor Yoder,  MRN: [id number]  PCP: London Pepper, MD  Date of Evaluation: 07/28/2020 Time spent:30 minutes  Chief Complaint:  Chief Complaint    Follow-up       HISTORY/CURRENT STATUS: HPI For routine med check.  Stressful b/c of IT contracting business. 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  He and his family recently bought a house in [place], a fixer-upper.  He is taking up his hobby of woodworking again.  So overall things are going well except for sleep issues.  For several months now he wakes up in the middle of the night and is unable to go back to sleep.  He already practices good sleep hygiene.  He is able to enjoy things. Energy and motivation are good.  He does not cry easily.  Not anxious most of the time.  Not isolating.  Appetite and weight are stable.  Denies suicidal or homicidal thoughts.  Patient denies increased energy with decreased need for sleep, no increased talkativeness, no racing thoughts, no impulsivity or risky behaviors, no increased spending, no increased libido, no irritability, no paranoia, no grandiosity, and no hallucinations.  Denies dizziness, syncope, seizures, numbness, tingling, tremor, tics, unsteady gait, slurred speech, confusion. Denies muscle or joint pain, stiffness, or dystonia.  Individual Medical History/ Review of Systems: Changes? :No    Past medications for mental health diagnoses include: Equetro, trazodone  Allergies: Patient has no known allergies.  Current Medications:  Current Outpatient Medications:  .  b complex vitamins capsule, Take 1 capsule by mouth daily., Disp: , Rfl:  .  traZODone (DESYREL) 100 MG tablet, Take 1-2 tablets (100-200 mg total) by mouth at bedtime as needed. for sleep, Disp: 180 tablet, Rfl: 1 .  zaleplon (SONATA) 10 MG capsule, 1 po qhs prn, and may repeat prn for midnocturnal awakening as long as he  has 3 hours left to sleep., Disp: 60 capsule, Rfl: 1 .  carbamazepine (TEGRETOL) 200 MG tablet, Take 1 tablet (200 mg total) by mouth 2 (two) times daily., Disp: 180 tablet, Rfl: 1 Medication Side Effects: none  Family Medical/ Social History: Changes? Yes recently moved  [place]:  There were no vitals taken for this visit.There is no height or weight on file to calculate BMI.  General Appearance: Casual, Neat and Well Groomed  Eye Contact:  Good  Speech:  Clear and Coherent and Normal Rate  Volume:  Normal  Mood:  Euthymic  Affect:  Appropriate  Thought Process:  Goal Directed and Descriptions of Associations: Intact  Orientation:  Full (Time, Place, and Person)  Thought Content: Logical   Suicidal Thoughts:  No  Homicidal Thoughts:  No  Memory:  WNL  Judgement:  Good  Insight:  Good  Psychomotor Activity:  Normal  Concentration:  Concentration: Good and Attention Span: Good  Recall:  Good  Fund of Knowledge: Good  Language: Good  Assets:  Desire for Improvement  ADL's:  Intact  Cognition: WNL  Prognosis:  Good   Labs: 07/18/2020 CBC completely normal. CMP glucose 101, electrolytes normal, kidney function normal, LFTs normal. Carbamazepine level 5.1 which is within normal range.  DIAGNOSES:    ICD-10-CM   1. Episodic mood disorder (HCC)  F39   2. Generalized anxiety disorder  F41.1   3. Insomnia, unspecified type  G47.00     Receiving Psychotherapy: No    RECOMMENDATIONS:  PDMP was reviewed. I  provided 30 minutes of face-to-face time during this encounter, including time spent before and after the visit reviewing past notes and his labs.  I discussed the lab results with him.  He is doing well so no changes will be made. Continue carbamazepine 200 mg, 1 p.o. twice daily. Continue trazodone 100 mg, 1-2 nightly as needed sleep. Start Sonata 10 mg, 1 p.o. nightly as needed and may repeat as needed for mid nocturnal awakening as long as he has 3 hours left  to sleep.  If this works well and he needs more before his next visit, this can be sent in. Return in 6 months.  Donnal Moat, PA-C

## 2020-07-30 DIAGNOSIS — G4700 Insomnia, unspecified: Secondary | ICD-10-CM | POA: Insufficient documentation

## 2020-07-30 DIAGNOSIS — F411 Generalized anxiety disorder: Secondary | ICD-10-CM | POA: Insufficient documentation

## 2020-10-07 ENCOUNTER — Other Ambulatory Visit: Payer: Self-pay | Admitting: Psychiatry

## 2020-10-07 ENCOUNTER — Telehealth: Payer: Self-pay | Admitting: Psychiatry

## 2020-10-07 MED ORDER — TRAZODONE HCL 100 MG PO TABS: 100.0000 mg | ORAL_TABLET | Freq: Every evening | ORAL | 0 refills | Status: DC | PRN

## 2020-10-07 MED ORDER — CARBAMAZEPINE 200 MG PO TABS: 200.0000 mg | ORAL_TABLET | Freq: Two times a day (BID) | ORAL | 0 refills | Status: DC

## 2020-10-07 NOTE — Telephone Encounter (Signed)
RTC x 2  Travelling  Needs meds  For a day  [facility] [phone]  Sent CBZ and trazodone  Lynder Parents, MD, DFAPA

## 2020-10-08 ENCOUNTER — Other Ambulatory Visit: Payer: Self-pay | Admitting: Physician Assistant

## 2021-01-06 ENCOUNTER — Other Ambulatory Visit: Payer: Self-pay | Admitting: Physician Assistant

## 2021-01-26 ENCOUNTER — Ambulatory Visit: Payer: BC Managed Care – PPO | Admitting: Physician Assistant

## 2021-04-06 ENCOUNTER — Other Ambulatory Visit: Payer: Self-pay | Admitting: Physician Assistant

## 2021-04-06 NOTE — Telephone Encounter (Signed)
Last seen 2/25 due back on 8/25

## 2021-05-06 ENCOUNTER — Other Ambulatory Visit: Payer: Self-pay | Admitting: Physician Assistant

## 2021-05-07 NOTE — Telephone Encounter (Signed)
Schedule apt with Trevor Yoder

## 2021-05-08 NOTE — Telephone Encounter (Signed)
Left message for pt to schedule

## 2021-05-10 ENCOUNTER — Other Ambulatory Visit: Payer: Self-pay

## 2021-05-10 MED ORDER — TRAZODONE HCL 100 MG PO TABS: ORAL_TABLET | ORAL | 0 refills | Status: DC

## 2021-05-10 MED ORDER — CARBAMAZEPINE 200 MG PO TABS: ORAL_TABLET | ORAL | 0 refills | Status: DC

## 2021-06-05 ENCOUNTER — Other Ambulatory Visit: Payer: Self-pay | Admitting: Physician Assistant

## 2021-06-22 ENCOUNTER — Encounter: Payer: Self-pay | Admitting: Physician Assistant

## 2021-06-22 ENCOUNTER — Other Ambulatory Visit: Payer: Self-pay

## 2021-06-22 ENCOUNTER — Ambulatory Visit (INDEPENDENT_AMBULATORY_CARE_PROVIDER_SITE_OTHER): Payer: BC Managed Care – PPO | Admitting: Physician Assistant

## 2021-06-22 DIAGNOSIS — G4700 Insomnia, unspecified: Secondary | ICD-10-CM

## 2021-06-22 DIAGNOSIS — F411 Generalized anxiety disorder: Secondary | ICD-10-CM

## 2021-06-22 DIAGNOSIS — Z79899 Other long term (current) drug therapy: Secondary | ICD-10-CM | POA: Diagnosis not present

## 2021-06-22 DIAGNOSIS — F39 Unspecified mood [affective] disorder: Secondary | ICD-10-CM

## 2021-06-22 MED ORDER — TRAZODONE HCL 100 MG PO TABS: 200.0000 mg | ORAL_TABLET | Freq: Every evening | ORAL | 5 refills | Status: DC | PRN

## 2021-06-22 MED ORDER — CARBAMAZEPINE 200 MG PO TABS: 200.0000 mg | ORAL_TABLET | Freq: Two times a day (BID) | ORAL | 5 refills | Status: DC

## 2021-06-22 NOTE — Progress Notes (Signed)
[facility]  Patient ID: Trevor Yoder,  MRN: [id number]  PCP: London Pepper, MD  Date of Evaluation: 06/22/2021. Time spent:30 minutes  Chief Complaint:  Chief Complaint   Depression; Insomnia; Follow-up     HISTORY/CURRENT STATUS: HPI For routine med check.  Almost 6 months overdue for his follow-up appointment.  He changed jobs since his last visit and that is a part of the reason he is late getting back in.  States he is doing well.  Liking his new job so far.  He is able to enjoy things.  He and his wife are enjoying living in the country in [place].  Energy and motivation are good.  Appetite and weight are stable.  He is not exercising but plans to start back sometime soon.  He is not overweight, just wants to get some cardio.  No suicidal or homicidal thoughts.  He still has trouble sleeping sometimes.  The trazodone is effective most of the time but there are times it seems like it does not work as well.  At the last visit almost a year ago we tried Quarry manager.  He did not feel that that was effective.  His sleep is really adequate, it is just that he is nodding off going to sleep by 9 usually and then wakes up around 4.  He usually gets on up and starts his day earlier.  Patient denies increased energy with decreased need for sleep, no increased talkativeness, no racing thoughts, no impulsivity or risky behaviors, no increased spending, no increased libido, no irritability, no paranoia, no grandiosity, and no hallucinations.  Denies dizziness, syncope, seizures, numbness, tingling, tremor, tics, unsteady gait, slurred speech, confusion. Denies muscle or joint pain, stiffness, or dystonia.  Individual Medical History/ Review of Systems: Changes? :No    Past medications for mental health diagnoses include: Equetro, trazodone  Allergies: Patient has no known allergies.  Current Medications:  Current Outpatient Medications:    b complex  vitamins capsule, Take 1 capsule by mouth daily., Disp: , Rfl:    carbamazepine (TEGRETOL) 200 MG tablet, Take 1 tablet (200 mg total) by mouth 2 (two) times daily., Disp: 60 tablet, Rfl: 5   traZODone (DESYREL) 100 MG tablet, Take 2 tablets (200 mg total) by mouth at bedtime as needed., Disp: 60 tablet, Rfl: 5   zaleplon (SONATA) 10 MG capsule, 1 po qhs prn, and may repeat prn for midnocturnal awakening as long as he has 3 hours left to sleep. (Patient not taking: Reported on 06/22/2021), Disp: 60 capsule, Rfl: 1 Medication Side Effects: none  Family Medical/ Social History: Changes? New job, back in [place]:  There were no vitals taken for this visit.There is no height or weight on file to calculate BMI.  General Appearance: Casual, Neat and Well Groomed  Eye Contact:  Good  Speech:  Clear and Coherent and Normal Rate  Volume:  Normal  Mood:  Euthymic  Affect:  Appropriate  Thought Process:  Goal Directed and Descriptions of Associations: Circumstantial  Orientation:  Full (Time, Place, and Person)  Thought Content: Logical   Suicidal Thoughts:  No  Homicidal Thoughts:  No  Memory:  WNL  Judgement:  Good  Insight:  Good  Psychomotor Activity:  Normal  Concentration:  Concentration: Good and Attention Span: Good  Recall:  Good  Fund of Knowledge: Good  Language: Good  Assets:  Desire for Improvement  ADL's:  Intact  Cognition: WNL  Prognosis:  Good    DIAGNOSES:    ICD-10-CM   1. Episodic mood disorder (HCC)  F39 CBC with Differential/Platelet    Comprehensive metabolic panel    Carbamazepine level, total    2. Generalized anxiety disorder  F41.1     3. Insomnia, unspecified type  G47.00     4. Encounter for long-term (current) use of medications  Z79.899 CBC with Differential/Platelet    Comprehensive metabolic panel    Carbamazepine level, total       Receiving Psychotherapy: No    RECOMMENDATIONS:  PDMP was reviewed.  Sonata filled  07/28/2020. I provided 30 minutes of face to face time during this encounter, including time spent before and after the visit in records review, medical decision making, counseling pertinent to today's visit, and charting.  Discussed his sleep.  If he would like to retry the [place], he could take 1 for mid nocturnal awakening as long as he has 4 hours left to sleep.  Otherwise we agreed to leave the trazodone the same.  He feels like it works well enough. Continue carbamazepine 200 mg, 1 p.o. twice daily. Continue trazodone 100 mg, 1-2 nightly as needed sleep. Labs ordered as above. Return in 6 months.  Donnal Moat, PA-C

## 2021-06-29 ENCOUNTER — Telehealth: Payer: Self-pay

## 2021-06-29 LAB — CBC WITH DIFFERENTIAL/PLATELET
Basophils Absolute: 0 10*3/uL (ref 0.0–0.2)
Basos: 1 %
EOS (ABSOLUTE): 0.1 10*3/uL (ref 0.0–0.4)
Eos: 1 %
Hematocrit: 40.7 % (ref 37.5–51.0)
Hemoglobin: 14.7 g/dL (ref 13.0–17.7)
Immature Grans (Abs): 0 10*3/uL (ref 0.0–0.1)
Immature Granulocytes: 0 %
Lymphocytes Absolute: 1.2 10*3/uL (ref 0.7–3.1)
Lymphs: 15 %
MCH: 33.3 pg — ABNORMAL HIGH (ref 26.6–33.0)
MCHC: 36.1 g/dL — ABNORMAL HIGH (ref 31.5–35.7)
MCV: 92 fL (ref 79–97)
Monocytes Absolute: 0.8 10*3/uL (ref 0.1–0.9)
Monocytes: 9 %
Neutrophils Absolute: 6.4 10*3/uL (ref 1.4–7.0)
Neutrophils: 74 %
Platelets: 245 10*3/uL (ref 150–450)
RBC: 4.42 x10E6/uL (ref 4.14–5.80)
RDW: 11.9 % (ref 11.6–15.4)
WBC: 8.6 10*3/uL (ref 3.4–10.8)

## 2021-06-29 LAB — COMPREHENSIVE METABOLIC PANEL
ALT: 16 IU/L (ref 0–44)
AST: 18 IU/L (ref 0–40)
Albumin/Globulin Ratio: 2.3 — ABNORMAL HIGH (ref 1.2–2.2)
Albumin: 4.6 g/dL (ref 3.8–4.9)
Alkaline Phosphatase: 56 IU/L (ref 44–121)
BUN/Creatinine Ratio: 15 (ref 9–20)
BUN: 11 mg/dL (ref 6–24)
Bilirubin Total: 0.4 mg/dL (ref 0.0–1.2)
CO2: 24 mmol/L (ref 20–29)
Calcium: 8.8 mg/dL (ref 8.7–10.2)
Chloride: 96 mmol/L (ref 96–106)
Creatinine, Ser: 0.73 mg/dL — ABNORMAL LOW (ref 0.76–1.27)
Globulin, Total: 2 g/dL (ref 1.5–4.5)
Glucose: 104 mg/dL — ABNORMAL HIGH (ref 70–99)
Potassium: 4.1 mmol/L (ref 3.5–5.2)
Sodium: 138 mmol/L (ref 134–144)
Total Protein: 6.6 g/dL (ref 6.0–8.5)
eGFR: 108 mL/min/{1.73_m2} (ref >59)

## 2021-06-29 LAB — CARBAMAZEPINE LEVEL, TOTAL: Carbamazepine (Tegretol), S: 6.3 ug/mL (ref 4.0–12.0)

## 2021-06-29 NOTE — Telephone Encounter (Signed)
VM left for patient to [facility] to lab results.

## 2021-07-02 NOTE — Telephone Encounter (Signed)
Called patient. Lab results/recommendations were given. He had no questions.

## 2021-07-10 ENCOUNTER — Other Ambulatory Visit: Payer: Self-pay

## 2021-07-10 ENCOUNTER — Encounter: Payer: Self-pay | Admitting: Physician Assistant

## 2021-07-10 ENCOUNTER — Ambulatory Visit: Payer: BC Managed Care – PPO | Admitting: Physician Assistant

## 2021-07-10 DIAGNOSIS — F411 Generalized anxiety disorder: Secondary | ICD-10-CM | POA: Diagnosis not present

## 2021-07-10 DIAGNOSIS — F331 Major depressive disorder, recurrent, moderate: Secondary | ICD-10-CM

## 2021-07-10 DIAGNOSIS — G4700 Insomnia, unspecified: Secondary | ICD-10-CM

## 2021-07-10 MED ORDER — FLUVOXAMINE MALEATE 100 MG PO TABS: ORAL_TABLET | ORAL | 1 refills | Status: DC

## 2021-07-10 MED ORDER — LORAZEPAM 0.5 MG PO TABS: 0.2500 mg | ORAL_TABLET | Freq: Three times a day (TID) | ORAL | 1 refills | Status: DC | PRN

## 2021-07-10 NOTE — Progress Notes (Signed)
[facility]  Patient ID: Trevor Yoder,  MRN: [id number]  PCP: London Pepper, MD  Date of Evaluation: 07/10/2021 Time spent:40 minutes  Chief Complaint:  Chief Complaint   Anxiety; Insomnia; Follow-up      HISTORY/CURRENT STATUS: HPI Not doing well.  Last seen 06/22/2021. There were things going on he didn't talk about with me. See ROS. He's started seeing [name] in counseling, has only seen him once.   Patient denies increased energy with decreased need for sleep, no increased talkativeness, no racing thoughts, no impulsivity or risky behaviors, no increased spending, no increased libido, no paranoia, no grandiosity, and no hallucinations.  Review of Systems  Constitutional:  Positive for malaise/fatigue.  HENT: Negative.    Eyes: Negative.   Respiratory: Negative.    Cardiovascular: Negative.   Gastrointestinal: Negative.   Genitourinary: Negative.   Musculoskeletal: Negative.   Skin: Negative.   Neurological:  Positive for dizziness and headaches.       "Light-deaded occasionaly." Also having 'mild' bilat sides h/a. Jaws clinch, has ringing in ears that comes and go.  Complains of 'brain fog.' Worse in few months but even worse in past 2 weeks or so. Hard time remembering things. Feels like word finding has decreased.   Endo/Heme/Allergies: Negative.   Psychiatric/Behavioral:  Positive for depression, memory loss and suicidal ideas. The patient is nervous/anxious and has insomnia.        Decreased memory in the past few months, worsening in past few weeks. Like going in the bathroom and looking at his toothbrush, not knowing what to do with it.  Has occasional thoughts of suicide "I don't have a plan, but I'm just tired of feeling like this.I feel hopeless. There's not a good way out." Feels anxious, ruminates about things he has no control over.  Trouble staying asleep. Will wake up around 3 in the morning and can't go back to sleep, sleeps well and  feels rested maybe 1-2 nights per week. Doesn't snore or wake up gasping for air. Melatonin has helped, but he forgets to take it. He only started trying it in the past month. We were going to try Sonata but he didn't have any at home, so he didn't re-try it. Lost his job after 3 weeks, last month. Looking for another job.     Individual Medical History/ Review of Systems: Changes? :Yes    see above.  Past medications for mental health diagnoses include: Equetro, trazodone, Sonata didn't help, Melatonin  Allergies: Patient has no known allergies.  Current Medications:  Current Outpatient Medications:    b complex vitamins capsule, Take 1 capsule by mouth daily., Disp: , Rfl:    carbamazepine (TEGRETOL) 200 MG tablet, Take 1 tablet (200 mg total) by mouth 2 (two) times daily., Disp: 60 tablet, Rfl: 5   fluvoxaMINE (LUVOX) 100 MG tablet, 1/2 po qhs for 2 weeks, then increase to 1 po qsh., Disp: 30 tablet, Rfl: 1   LORazepam (ATIVAN) 0.5 MG tablet, Take 0.5-1 tablets (0.25-0.5 mg total) by mouth every 8 (eight) hours as needed for anxiety., Disp: 30 tablet, Rfl: 1   Melatonin 3-10 MG TABS, Take by mouth., Disp: , Rfl:    traZODone (DESYREL) 100 MG tablet, Take 2 tablets (200 mg total) by mouth at bedtime as needed., Disp: 60 tablet, Rfl: 5   zaleplon (SONATA) 10 MG capsule, 1 po qhs prn, and may repeat prn for midnocturnal awakening as long as he has 3 hours left to sleep. (Patient  not taking: Reported on 06/22/2021), Disp: 60 capsule, Rfl: 1 Medication Side Effects: none  Family Medical/ Social History: Changes? Was let go from his job 06/26/2021.  MENTAL HEALTH EXAM:  There were no vitals taken for this visit.There is no height or weight on file to calculate BMI.  General Appearance: Casual, Neat and Well Groomed  Eye Contact:  Good  Speech:  Clear and Coherent and Normal Rate  Volume:  Normal  Mood:  Anxious and Depressed  Affect:  Congruent, Depressed, and Anxious  Thought Process:   Goal Directed and Descriptions of Associations: Circumstantial  Orientation:  Full (Time, Place, and Person)  Thought Content: Logical   Suicidal Thoughts:  No  Homicidal Thoughts:  No  Memory:  WNL  Judgement:  Good  Insight:  Good  Psychomotor Activity:  Normal  Concentration:  Concentration: Fair and Attention Span: Fair  Recall:  Good  Fund of Knowledge: Good  Language: Good  Assets:  Desire for Improvement  ADL's:  Intact  Cognition: WNL  Prognosis:  Good   06/28/2021  Labs CBC nl CMP Glu 104, Cr 0.73 CBZ level 6.3   DIAGNOSES:    ICD-10-CM   1. Major depressive disorder, recurrent episode, moderate (HCC)  F33.1     2. Generalized anxiety disorder  F41.1     3. Insomnia, unspecified type  G47.00         Receiving Psychotherapy: Yes   [name]   RECOMMENDATIONS:  PDMP was reviewed.  I provided 40 minutes of face to face time during this encounter, including time spent before and after the visit in records review, medical decision making, counseling pertinent to today's visit, and charting.  Disc sx. Depression can cause dec concentration, me ory issues, and other 'brain fog' sx. Rec tx that first but if no better, or worse at any point, rec neuro referral and neuropsych Rec Luvox, helps dep, anx, and sleep. Benefits risks and side effects disc and he accepts. Disc adding BZ. We discussed the short-term and long-term risks of benzodiazepines including sedation, increased risk of falling, dizziness, tolerance, and addictive potential.  Patient understands and accepts.   Start ativan 0.5 mg, 1/2-1 tid prn. Start Luvox 100 mg, 1/2 qhs for 2 wks then 1 qhs. Continue carbamazepine 200 mg, 1 p.o. twice daily. Continue trazodone 100 mg, 1-2 nightly as needed sleep. Start Vitamin D, 2000 iu, continue B Complex. Continue therapy w/ [name] Return in [place], [place]

## 2021-07-30 ENCOUNTER — Telehealth: Payer: Self-pay | Admitting: Physician Assistant

## 2021-07-30 NOTE — Telephone Encounter (Signed)
The Luvox caused mania it sounds like. Glad he's doing better off it.

## 2021-07-30 NOTE — Telephone Encounter (Signed)
Pt stated he felt wired and could not sleep the whole time he took 1/2 tab for 2 weeks.He feels fine now and depression is better as well,he does not think he needs an anti depressant anymore.

## 2021-07-30 NOTE — Telephone Encounter (Signed)
Pt called and cancelled his appt for 3/30.He said that the luvox 100 mg he was taking gave him horrible side effects and he has stopped the medicine. He is doing well now that he has stopped the medicine. If you have any questions you can call him at [phone]

## 2021-08-30 ENCOUNTER — Ambulatory Visit: Payer: BC Managed Care – PPO | Admitting: Physician Assistant

## 2021-12-05 ENCOUNTER — Encounter: Payer: Self-pay | Admitting: Family Medicine

## 2021-12-05 ENCOUNTER — Ambulatory Visit: Payer: BC Managed Care – PPO | Admitting: Family Medicine

## 2021-12-05 VITALS — BP 138/76 | HR 80 | Ht 64.0 in | Wt 174.8 lb

## 2021-12-05 DIAGNOSIS — R7301 Impaired fasting glucose: Secondary | ICD-10-CM

## 2021-12-05 DIAGNOSIS — Z0001 Encounter for general adult medical examination with abnormal findings: Secondary | ICD-10-CM | POA: Diagnosis not present

## 2021-12-05 DIAGNOSIS — Z1159 Encounter for screening for other viral diseases: Secondary | ICD-10-CM

## 2021-12-05 DIAGNOSIS — E559 Vitamin D deficiency, unspecified: Secondary | ICD-10-CM

## 2021-12-05 DIAGNOSIS — Z114 Encounter for screening for human immunodeficiency virus [HIV]: Secondary | ICD-10-CM

## 2021-12-05 DIAGNOSIS — F411 Generalized anxiety disorder: Secondary | ICD-10-CM

## 2021-12-05 DIAGNOSIS — F323 Major depressive disorder, single episode, severe with psychotic features: Secondary | ICD-10-CM | POA: Diagnosis not present

## 2021-12-05 DIAGNOSIS — Z1211 Encounter for screening for malignant neoplasm of colon: Secondary | ICD-10-CM | POA: Diagnosis not present

## 2021-12-05 NOTE — Progress Notes (Signed)
New Patient Office Visit  Subjective:  Patient ID: Trevor Yoder, male    DOB: May 18, 1966  Age: 56 y.o. MRN: [id number]  CC:  Chief Complaint  Patient presents with   New Patient (Initial Visit)    Establishing care, would like to discuss his left hand states he has dropped things is unsure what this could be related too.     HPI Trevor Yoder is a 56 y.o. male with past medical history of Bipolar, ADD, insomina presents for establishing care. He c/o of dropping things. The onset of symptoms was 3-4 months ago. He reports that he sometimes forgets that he's holding an object, relieving his grip on the object. He denies numbness, tremors, tingling, and pain in the left hand. The frequency is once a month.  Past Medical History:  Diagnosis Date   Mood disorder [facility])     Past Surgical History:  Procedure Laterality Date   CERVICAL DISCECTOMY  2011    Family History  Problem Relation Age of Onset   Diabetes Mother    Hyperlipidemia Mother    Hypertension Mother    Heart disease Maternal Grandmother    Cancer Paternal Grandfather    Breast cancer Neg Hx     Social History   Socioeconomic History   Marital status: Married    Spouse name: Not on file   Number of children: Not on file   Years of education: 16   Highest education level: Not on file  Occupational History   Occupation: Programmer  Tobacco Use   Smoking status: Former    Types: Cigarettes    Quit date: 04/27/2009    Years since quitting: 12.6   Smokeless tobacco: Never  Substance and Sexual Activity   Alcohol use: Yes    Alcohol/week: 10.0 standard drinks of alcohol    Types: 10 Cans of beer per week    Comment: per week   Drug use: No   Sexual activity: Yes    Partners: Female  Other Topics Concern   Not on file  Social History Narrative   Not on file   Social Determinants of Health   Financial Resource Strain: Not on file  Food Insecurity: Not on file  Transportation Needs: Not on  file  Physical Activity: Not on file  Stress: Not on file  Social Connections: Not on file  Intimate Partner Violence: Not on file    ROS Review of Systems  Constitutional:  Negative for chills, fatigue and fever.  HENT:  Negative for sinus pressure, sinus pain, sneezing and sore throat.   Eyes:  Negative for photophobia, pain and redness.  Respiratory:  Negative for chest tightness, shortness of breath and wheezing.   Cardiovascular:  Negative for chest pain and palpitations.  Endocrine: Negative for polydipsia, polyphagia and polyuria.  Genitourinary:  Negative for hematuria and urgency.  Skin:  Negative for rash and wound.  Neurological:  Negative for dizziness, tremors, weakness, numbness and headaches.  Psychiatric/Behavioral:  Positive for suicidal ideas (once or twice a month). Negative for self-injury.     Objective:   Today's Vitals: BP 138/76   Pulse 80   Ht $R'5\' 4"'KF$  (1.626 m)   Wt 174 lb 12.8 oz (79.3 kg)   SpO2 94%   BMI 30.00 kg/m   Physical Exam HENT:     Head: Normocephalic.     Nose: Nose normal.     Mouth/Throat:     Mouth: Mucous membranes are moist.  Eyes:  Extraocular Movements: Extraocular movements intact.     Pupils: Pupils are equal, round, and reactive to light.  Cardiovascular:     Rate and Rhythm: Normal rate.     Pulses: Normal pulses.     Heart sounds: Normal heart sounds.  Pulmonary:     Effort: Pulmonary effort is normal.     Breath sounds: Normal breath sounds.  Abdominal:     Palpations: Abdomen is soft.  Musculoskeletal:        General: No deformity or signs of injury.     Cervical back: No rigidity.     Comments: Negative phalen and tinel sign   Skin:    Findings: No lesion or rash.  Neurological:     Mental Status: He is alert and oriented to person, place, and time.  Psychiatric:     Comments: Normal affect     Assessment & Plan:   Problem List Items Addressed This Visit       Other   Generalized anxiety disorder    Major depressive disorder, single episode with psychotic features ([place]) - Primary    -reports not taking his antidepressants  -reports feeling more depressed and suicidal when on treatment -admits to having suicidal thoughts with no written plan -no suicidal thoughts reported today - loss of hand grip likely  from somatic symptoms associated with a psychiatric disorder -  will reevaluate at his next visit - pt is following up with behavioral health      Other Visit Diagnoses     Colon cancer screening       Relevant Orders   Ambulatory referral to Gastroenterology   Encounter for screening for HIV       Relevant Orders   HIV antibody (with reflex)   Need for hepatitis C screening test       Relevant Orders   Hepatitis C Antibody   Vitamin D deficiency       Relevant Orders   Vitamin D (25 hydroxy)   IFG (impaired fasting glucose)       Relevant Orders   Hemoglobin A1C   Encounter for general adult medical examination with abnormal findings       Relevant Orders   TSH + free T4   Lipid panel   CBC with Differential/Platelet   CMP14+EGFR       Outpatient Encounter Medications as of 12/05/2021  Medication Sig   b complex vitamins capsule Take 1 capsule by mouth daily.   carbamazepine (TEGRETOL) 200 MG tablet Take 1 tablet (200 mg total) by mouth 2 (two) times daily.   cholecalciferol (VITAMIN D3) 25 MCG (1000 UNIT) tablet Take 1,000 Units by mouth daily.   Melatonin 3-10 MG TABS Take by mouth.   traZODone (DESYREL) 100 MG tablet Take 2 tablets (200 mg total) by mouth at bedtime as needed.   fluvoxaMINE (LUVOX) 100 MG tablet 1/2 po qhs for 2 weeks, then increase to 1 po qsh. (Patient not taking: Reported on 12/05/2021)   LORazepam (ATIVAN) 0.5 MG tablet Take 0.5-1 tablets (0.25-0.5 mg total) by mouth every 8 (eight) hours as needed for anxiety. (Patient not taking: Reported on 12/05/2021)   zaleplon (SONATA) 10 MG capsule 1 po qhs prn, and may repeat prn for midnocturnal  awakening as long as he has 3 hours left to sleep. (Patient not taking: Reported on 12/05/2021)   No facility-administered encounter medications on file as of 12/05/2021.    Follow-up: Return in about 2 months (around 02/05/2022).   Peter Congo  Louanna Raw, FNP

## 2021-12-05 NOTE — Assessment & Plan Note (Addendum)
-  reports not taking his antidepressants  -reports feeling more depressed and suicidal when on treatment -admits to having suicidal thoughts with no written plan -no suicidal thoughts reported today - loss of hand grip likely  from somatic symptoms associated with a psychiatric disorder -  will reevaluate at his next visit - pt is following up with behavioral health

## 2021-12-05 NOTE — Patient Instructions (Addendum)
I appreciate the opportunity to provide care to you today!    Follow up:  2 months  Labs: please stop by the lab during the week to get your blood drawn (CBC, CMP, TSH, Lipid profile, HgA1c, Vit D)  Screening: HIV and Hep C    Please continue to a heart-healthy diet and increase your physical activities. Try to exercise for 13mns at least three times a week.      It was a pleasure to see you and I look forward to continuing to work together on your health and well-being. Please do not hesitate to call the office if you need care or have questions about your care.   Have a wonderful day and week. With Gratitude, GAlvira MondayMSN, FNP-BC

## 2021-12-08 LAB — CBC WITH DIFFERENTIAL/PLATELET
Basophils Absolute: 0 10*3/uL (ref 0.0–0.2)
Basos: 1 %
EOS (ABSOLUTE): 0.1 10*3/uL (ref 0.0–0.4)
Eos: 4 %
Hematocrit: 43.2 % (ref 37.5–51.0)
Hemoglobin: 14.5 g/dL (ref 13.0–17.7)
Immature Grans (Abs): 0 10*3/uL (ref 0.0–0.1)
Immature Granulocytes: 0 %
Lymphocytes Absolute: 1.1 10*3/uL (ref 0.7–3.1)
Lymphs: 34 %
MCH: 32.1 pg (ref 26.6–33.0)
MCHC: 33.6 g/dL (ref 31.5–35.7)
MCV: 96 fL (ref 79–97)
Monocytes Absolute: 0.5 10*3/uL (ref 0.1–0.9)
Monocytes: 14 %
Neutrophils Absolute: 1.6 10*3/uL (ref 1.4–7.0)
Neutrophils: 47 %
Platelets: 213 10*3/uL (ref 150–450)
RBC: 4.52 x10E6/uL (ref 4.14–5.80)
RDW: 12.6 % (ref 11.6–15.4)
WBC: 3.3 10*3/uL — ABNORMAL LOW (ref 3.4–10.8)

## 2021-12-08 LAB — LIPID PANEL
Chol/HDL Ratio: 2.8 ratio (ref 0.0–5.0)
Cholesterol, Total: 238 mg/dL — ABNORMAL HIGH (ref 100–199)
HDL: 86 mg/dL (ref >39)
LDL Chol Calc (NIH): 136 mg/dL — ABNORMAL HIGH (ref 0–99)
Triglycerides: 96 mg/dL (ref 0–149)
VLDL Cholesterol Cal: 16 mg/dL (ref 5–40)

## 2021-12-08 LAB — CMP14+EGFR
ALT: 15 IU/L (ref 0–44)
AST: 18 IU/L (ref 0–40)
Albumin/Globulin Ratio: 2.9 — ABNORMAL HIGH (ref 1.2–2.2)
Albumin: 4.6 g/dL (ref 3.8–4.9)
Alkaline Phosphatase: 47 IU/L (ref 44–121)
BUN/Creatinine Ratio: 18 (ref 9–20)
BUN: 14 mg/dL (ref 6–24)
Bilirubin Total: 0.4 mg/dL (ref 0.0–1.2)
CO2: 25 mmol/L (ref 20–29)
Calcium: 9 mg/dL (ref 8.7–10.2)
Chloride: 97 mmol/L (ref 96–106)
Creatinine, Ser: 0.78 mg/dL (ref 0.76–1.27)
Globulin, Total: 1.6 g/dL (ref 1.5–4.5)
Glucose: 91 mg/dL (ref 70–99)
Potassium: 4.9 mmol/L (ref 3.5–5.2)
Sodium: 136 mmol/L (ref 134–144)
Total Protein: 6.2 g/dL (ref 6.0–8.5)
eGFR: 105 mL/min/{1.73_m2} (ref >59)

## 2021-12-08 LAB — HIV ANTIBODY (ROUTINE TESTING W REFLEX): HIV Screen 4th Generation wRfx: NONREACTIVE

## 2021-12-08 LAB — VITAMIN D 25 HYDROXY (VIT D DEFICIENCY, FRACTURES): Vit D, 25-Hydroxy: 35.2 ng/mL (ref 30.0–100.0)

## 2021-12-08 LAB — HEMOGLOBIN A1C
Est. average glucose Bld gHb Est-mCnc: 103 mg/dL
Hgb A1c MFr Bld: 5.2 % (ref 4.8–5.6)

## 2021-12-08 LAB — TSH+FREE T4
Free T4: 0.94 ng/dL (ref 0.82–1.77)
TSH: 1.52 u[IU]/mL (ref 0.450–4.500)

## 2021-12-08 LAB — HEPATITIS C ANTIBODY: Hep C Virus Ab: NONREACTIVE

## 2021-12-10 ENCOUNTER — Encounter: Payer: Self-pay | Admitting: *Deleted

## 2021-12-10 NOTE — Progress Notes (Signed)
Please inform the patient that his cholesterol is slightly elevated. I recommend low carbs and fat diet.  All other labs are stable.

## 2021-12-21 ENCOUNTER — Ambulatory Visit: Payer: BC Managed Care – PPO | Admitting: Physician Assistant

## 2021-12-21 ENCOUNTER — Encounter: Payer: Self-pay | Admitting: Physician Assistant

## 2021-12-21 VITALS — BP 139/93 | HR 64

## 2021-12-21 DIAGNOSIS — F900 Attention-deficit hyperactivity disorder, predominantly inattentive type: Secondary | ICD-10-CM | POA: Diagnosis not present

## 2021-12-21 DIAGNOSIS — F39 Unspecified mood [affective] disorder: Secondary | ICD-10-CM

## 2021-12-21 DIAGNOSIS — G4700 Insomnia, unspecified: Secondary | ICD-10-CM

## 2021-12-21 DIAGNOSIS — F411 Generalized anxiety disorder: Secondary | ICD-10-CM

## 2021-12-21 MED ORDER — TRAZODONE HCL 100 MG PO TABS: 200.0000 mg | ORAL_TABLET | Freq: Every evening | ORAL | 1 refills | Status: DC | PRN

## 2021-12-21 MED ORDER — CLONIDINE HCL 0.1 MG PO TABS: 0.1000 mg | ORAL_TABLET | Freq: Every day | ORAL | 1 refills | Status: DC

## 2021-12-21 MED ORDER — CARBAMAZEPINE 200 MG PO TABS
200.0000 mg | ORAL_TABLET | Freq: Two times a day (BID) | ORAL | 1 refills | Status: DC
Start: 2021-12-21 — End: 2021-12-31

## 2021-12-21 NOTE — Progress Notes (Unsigned)
[facility]  Patient ID: Trevor Yoder,  MRN: [id number]  PCP: Alvira Monday, [place]  Date of Evaluation: 12/21/2021 Time spent:40 minutes  Chief Complaint:  Chief Complaint   Follow-up    HISTORY/CURRENT STATUS: HPI  Wants to discuss ADHD. Trevor Yoder, his wife is with him.    Luvox caused mania.  Ativan made him feel worse.    Individual Medical History/ Review of Systems: Changes? :No      Past medications for mental health diagnoses include: Equetro, trazodone, Sonata didn't help, Melatonin  Allergies: Luvox [fluvoxamine]  Current Medications:  Current Outpatient Medications:    b complex vitamins capsule, Take 1 capsule by mouth daily., Disp: , Rfl:    cholecalciferol (VITAMIN D3) 25 MCG (1000 UNIT) tablet, Take 1,000 Units by mouth daily., Disp: , Rfl:    cloNIDine (CATAPRES) 0.1 MG tablet, Take 1 tablet (0.1 mg total) by mouth at bedtime., Disp: 30 tablet, Rfl: 1   carbamazepine (TEGRETOL) 200 MG tablet, Take 1 tablet (200 mg total) by mouth 2 (two) times daily., Disp: 180 tablet, Rfl: 1   LORazepam (ATIVAN) 0.5 MG tablet, Take 0.5-1 tablets (0.25-0.5 mg total) by mouth every 8 (eight) hours as needed for anxiety. (Patient not taking: Reported on 12/05/2021), Disp: 30 tablet, Rfl: 1   Melatonin 3-10 MG TABS, Take by mouth. (Patient not taking: Reported on 12/21/2021), Disp: , Rfl:    traZODone (DESYREL) 100 MG tablet, Take 2 tablets (200 mg total) by mouth at bedtime as needed., Disp: 180 tablet, Rfl: 1   zaleplon (SONATA) 10 MG capsule, 1 po qhs prn, and may repeat prn for midnocturnal awakening as long as he has 3 hours left to sleep. (Patient not taking: Reported on 12/05/2021), Disp: 60 capsule, Rfl: 1 Medication Side Effects: none  Family Medical/ Social History: Changes? Was let go from his job 06/26/2021.  MENTAL HEALTH EXAM:  Blood pressure (!) 139/93, pulse 64.There is no height or weight on file to calculate BMI.  General Appearance: Casual, Neat and  Well Groomed  Eye Contact:  Good  Speech:  Clear and Coherent and Normal Rate  Volume:  Normal  Mood:  Anxious and Depressed  Affect:  Congruent, Depressed, and Anxious  Thought Process:  Goal Directed and Descriptions of Associations: Circumstantial  Orientation:  Full (Time, Place, and Person)  Thought Content: Logical   Suicidal Thoughts:  No  Homicidal Thoughts:  No  Memory:  WNL  Judgement:  Good  Insight:  Good  Psychomotor Activity:  Normal  Concentration:  Concentration: Fair and Attention Span: Fair  Recall:  Good  Fund of Knowledge: Good  Language: Good  Assets:  Desire for Improvement  ADL's:  Intact  Cognition: WNL  Prognosis:  Good   06/28/2021  Labs CBC nl CMP Glu 104, Cr 0.73 CBZ level 6.3   DIAGNOSES:    ICD-10-CM   1. Generalized anxiety disorder  F41.1     2. Attention deficit hyperactivity disorder (ADHD), predominantly inattentive type  F90.0       Receiving Psychotherapy: Yes   Trevor Yoder   RECOMMENDATIONS:  PDMP was reviewed.  Ativan filled 07/10/2021.     Start ativan 0.5 mg, 1/2-1 tid prn. Start Luvox 100 mg, 1/2 qhs for 2 wks then 1 qhs. Continue carbamazepine 200 mg, 1 p.o. twice daily. Continue trazodone 100 mg, 1-2 nightly as needed sleep. Start Vitamin D, 2000 iu, continue B Complex. Continue therapy w/ Trevor Yoder Return in [place], [place]

## 2021-12-28 ENCOUNTER — Encounter (HOSPITAL_COMMUNITY): Payer: Self-pay | Admitting: Student

## 2021-12-28 ENCOUNTER — Telehealth: Payer: Self-pay | Admitting: Physician Assistant

## 2021-12-28 ENCOUNTER — Ambulatory Visit (HOSPITAL_COMMUNITY)
Admission: EM | Admit: 2021-12-28 | Discharge: 2021-12-28 | Disposition: A | Discharge status: Home / Self Care | Payer: BC Managed Care – PPO | Attending: Psychiatry | Admitting: Psychiatry

## 2021-12-28 DIAGNOSIS — F39 Unspecified mood [affective] disorder: Secondary | ICD-10-CM | POA: Insufficient documentation

## 2021-12-28 DIAGNOSIS — G4700 Insomnia, unspecified: Secondary | ICD-10-CM

## 2021-12-28 DIAGNOSIS — E782 Mixed hyperlipidemia: Secondary | ICD-10-CM | POA: Diagnosis not present

## 2021-12-28 DIAGNOSIS — Z20822 Contact with and (suspected) exposure to covid-19: Secondary | ICD-10-CM | POA: Insufficient documentation

## 2021-12-28 DIAGNOSIS — F323 Major depressive disorder, single episode, severe with psychotic features: Secondary | ICD-10-CM | POA: Diagnosis not present

## 2021-12-28 DIAGNOSIS — F1090 Alcohol use, unspecified, uncomplicated: Secondary | ICD-10-CM | POA: Diagnosis present

## 2021-12-28 DIAGNOSIS — F411 Generalized anxiety disorder: Secondary | ICD-10-CM

## 2021-12-28 DIAGNOSIS — F1020 Alcohol dependence, uncomplicated: Secondary | ICD-10-CM | POA: Insufficient documentation

## 2021-12-28 LAB — LIPID PANEL
Cholesterol: 249 mg/dL — ABNORMAL HIGH (ref 0–200)
HDL: 79 mg/dL (ref >40)
LDL Cholesterol: 110 mg/dL — ABNORMAL HIGH (ref 0–99)
Total CHOL/HDL Ratio: 3.2 RATIO
Triglycerides: 300 mg/dL — ABNORMAL HIGH (ref <150)
VLDL: 60 mg/dL — ABNORMAL HIGH (ref 0–40)

## 2021-12-28 LAB — HEMOGLOBIN A1C
Hgb A1c MFr Bld: 5.1 % (ref 4.8–5.6)
Mean Plasma Glucose: 99.67 mg/dL

## 2021-12-28 LAB — RESP PANEL BY RT-PCR (FLU A&B, COVID) ARPGX2
Influenza A by PCR: NEGATIVE
Influenza B by PCR: NEGATIVE
SARS Coronavirus 2 by RT PCR: NEGATIVE

## 2021-12-28 LAB — TSH: TSH: 1.183 u[IU]/mL (ref 0.350–4.500)

## 2021-12-28 LAB — CBC WITH DIFFERENTIAL/PLATELET
Abs Immature Granulocytes: 0.03 10*3/uL (ref 0.00–0.07)
Basophils Absolute: 0 10*3/uL (ref 0.0–0.1)
Basophils Relative: 1 %
Eosinophils Absolute: 0 10*3/uL (ref 0.0–0.5)
Eosinophils Relative: 0 %
HCT: 40.4 % (ref 39.0–52.0)
Hemoglobin: 14.8 g/dL (ref 13.0–17.0)
Immature Granulocytes: 0 %
Lymphocytes Relative: 13 %
Lymphs Abs: 1 10*3/uL (ref 0.7–4.0)
MCH: 33.5 pg (ref 26.0–34.0)
MCHC: 36.6 g/dL — ABNORMAL HIGH (ref 30.0–36.0)
MCV: 91.4 fL (ref 80.0–100.0)
Monocytes Absolute: 0.6 10*3/uL (ref 0.1–1.0)
Monocytes Relative: 8 %
Neutro Abs: 5.8 10*3/uL (ref 1.7–7.7)
Neutrophils Relative %: 78 %
Platelets: 232 10*3/uL (ref 150–400)
RBC: 4.42 MIL/uL (ref 4.22–5.81)
RDW: 11.6 % (ref 11.5–15.5)
WBC: 7.5 10*3/uL (ref 4.0–10.5)
nRBC: 0 % (ref 0.0–0.2)

## 2021-12-28 LAB — COMPREHENSIVE METABOLIC PANEL
ALT: 20 U/L (ref 0–44)
AST: 20 U/L (ref 15–41)
Albumin: 4.3 g/dL (ref 3.5–5.0)
Alkaline Phosphatase: 45 U/L (ref 38–126)
Anion gap: 12 (ref 5–15)
BUN: 14 mg/dL (ref 6–20)
CO2: 24 mmol/L (ref 22–32)
Calcium: 9.1 mg/dL (ref 8.9–10.3)
Chloride: 95 mmol/L — ABNORMAL LOW (ref 98–111)
Creatinine, Ser: 0.78 mg/dL (ref 0.61–1.24)
GFR, Estimated: >60 mL/min (ref >60)
Glucose, Bld: 107 mg/dL — ABNORMAL HIGH (ref 70–99)
Potassium: 4.1 mmol/L (ref 3.5–5.1)
Sodium: 131 mmol/L — ABNORMAL LOW (ref 135–145)
Total Bilirubin: 0.6 mg/dL (ref 0.3–1.2)
Total Protein: 6.5 g/dL (ref 6.5–8.1)

## 2021-12-28 LAB — CARBAMAZEPINE LEVEL, TOTAL: Carbamazepine Lvl: 7.2 ug/mL (ref 4.0–12.0)

## 2021-12-28 LAB — ETHANOL: Alcohol, Ethyl (B): <10 mg/dL (ref <10)

## 2021-12-28 MED ORDER — ONDANSETRON 4 MG PO TBDP: 4.0000 mg | ORAL_TABLET | Freq: Four times a day (QID) | ORAL | Status: DC | PRN

## 2021-12-28 MED ORDER — THIAMINE HCL 100 MG PO TABS: 100.0000 mg | ORAL_TABLET | Freq: Every day | ORAL | Status: DC

## 2021-12-28 MED ORDER — CHLORDIAZEPOXIDE HCL 25 MG PO CAPS: 25.0000 mg | ORAL_CAPSULE | ORAL | Status: DC

## 2021-12-28 MED ORDER — CHLORDIAZEPOXIDE HCL 25 MG PO CAPS: 25.0000 mg | ORAL_CAPSULE | Freq: Three times a day (TID) | ORAL | Status: DC

## 2021-12-28 MED ORDER — ALUM & MAG HYDROXIDE-SIMETH 200-200-20 MG/5ML PO SUSP: 30.0000 mL | ORAL | Status: DC | PRN

## 2021-12-28 MED ORDER — HYDROXYZINE HCL 25 MG PO TABS: 25.0000 mg | ORAL_TABLET | Freq: Four times a day (QID) | ORAL | Status: DC | PRN

## 2021-12-28 MED ORDER — MAGNESIUM HYDROXIDE 400 MG/5ML PO SUSP: 30.0000 mL | Freq: Every day | ORAL | Status: DC | PRN

## 2021-12-28 MED ORDER — CHLORDIAZEPOXIDE HCL 25 MG PO CAPS: 25.0000 mg | ORAL_CAPSULE | Freq: Every day | ORAL | Status: DC

## 2021-12-28 MED ORDER — ACETAMINOPHEN 325 MG PO TABS: 650.0000 mg | ORAL_TABLET | Freq: Four times a day (QID) | ORAL | Status: DC | PRN

## 2021-12-28 MED ORDER — CHLORDIAZEPOXIDE HCL 25 MG PO CAPS: 25.0000 mg | ORAL_CAPSULE | Freq: Four times a day (QID) | ORAL | Status: DC | PRN

## 2021-12-28 MED ORDER — CHLORDIAZEPOXIDE HCL 25 MG PO CAPS: 25.0000 mg | ORAL_CAPSULE | Freq: Four times a day (QID) | ORAL | Status: DC

## 2021-12-28 MED ORDER — LOPERAMIDE HCL 2 MG PO CAPS: 2.0000 mg | ORAL_CAPSULE | ORAL | Status: DC | PRN

## 2021-12-28 NOTE — Telephone Encounter (Signed)
Wife, Ermalinda Barrios, called at 10:57 and LM reporting that Wilkie was in crisis.  I called the patient back at 11:30 and spoke with Berneta Sages.  He is having side effects from the clonidine that he just started.  Makes him very agitated and he is tolerating it all and is not to continue it.  He has also cut back on drinking and feels he is going through withdrawal.  Admitted he was an alcoholic.  He needed to see someone.  I referred for immediate help to [facility] urgent care center.  He is in [place] through so I told him to call the Urgent Care before coming to be sure they would see since he doesn't live in [place].  I told him then to go to [facility] which would be the hosiptal in [place].  He did make appt here for Monday morning, but again told him to still seek help today. He agreed to do this.

## 2021-12-28 NOTE — Telephone Encounter (Signed)
FYI:  Please see message string from today.

## 2021-12-28 NOTE — Telephone Encounter (Signed)
Reviewed and agree w/ referral to [facility]

## 2021-12-28 NOTE — ED Triage Notes (Signed)
Pt presents to [facility] voluntarily accompanied by his wife. Pt states "I am a self-diagnosed alcoholic". Pt reports drinking 2-3 beers daily but has now cut back to 1 beer. Pt reports consuming 1 beer lastnight, and CBD this morning "just a tiny bit". Pt reports feeling passive SI lastnight but states he no longer has that feeling. Pt states he just wants to fix his life. Pt reports Bipolar II diagnosis and is compliant with medication. [facility] and was last seen 1 week ago. Pt denies withdrawal symptoms at this moment. Pt denies SI/HI and AVH at this time.

## 2021-12-28 NOTE — Telephone Encounter (Signed)
I have been unable to reach patient or wife. I checked Epic and there is an entry for [facility] today.

## 2021-12-28 NOTE — ED Notes (Signed)
Patient A&O x 4, ambulatory. Patient discharged in no acute distress. Patient denied SI/HI, A/VH upon discharge. Patient verbalized understanding of all discharge instructions explained by staff, to include follow up appointments and safety plan. Patient escorted to lobby via staff. Safety maintained.

## 2021-12-28 NOTE — BH Assessment (Signed)
LCSW Progress Note   Per Merrily Brittle, DO, this pt does not require psychiatric hospitalization at this time.  Pt is psychiatrically cleared.  Discharge instructions include resources for AA meetings, other substance use treatment facilities, and instructions to contact [facility] for CD-IOP if he does not hear back from the facilitator next week.  Referral for CD-IOP made by LCSW to Steele Sizer, LCSW.  EDP Merrily Brittle, DO, has been notified.  Omelia Blackwater, MSW, [place] [phone] or [phone]

## 2021-12-28 NOTE — Telephone Encounter (Signed)
Tried twice to call patient and get message that call cannot be completed at this time. LVM on wife's cell to RC.
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
## 2021-12-28 NOTE — Discharge Instructions (Addendum)
EMERGENCY Suicide hotline: [address]: [place]: [place]. [place], [place] [address]. [phone]  Dear Trevor Yoder,  Please make regular appointments with an outpatient psychiatrist and other doctors once you leave the hospital (if any, otherwise, please see below for resources to make an appointment).  For therapy outside the hospital, please ask for these specific types of therapy: DBT  To see which pharmacy near you is the CHEAPEST for certain medications, please use GoodRx. It is free website and has a free phone app.    Also consider looking at [facility] $4.00 or [facility]'s $7.00 prescription list. Both are free to view if googled "[facility]mart $4 prescription" and "public's $7 prescription". These are set prices, no insurance required. [facility]'s low cost medications: $4-$15 for 30days prescriptions or $10-$38 for 90days prescriptions  For issues with sleep, please use this free app for insomnia called CBT-I. Let your doctors and therapists know so they can help with extra tips and tricks or for guidance and accountability. NO ADDS on the app.      [facility] - for CD-IOP [address] [place] or [place], [place], [address] [phone] office phone Steele Sizer, [place]    12 STEP PROGRAMS:  Alcoholics Anonymous of [place] ReportZoo.com.cy  Al-Anon of [place], [place] [email or web address].org/find-meetings.html      CHARITABLE RESIDENTIAL REHABS   [facility] (men only at this campus) [place], [place] [address] [phone]  ((These programs listed above have a one-time application fee.))   [place] [address] [place] [address] [phone]  [place] [address] [place] [address] [phone] II Ch[facility] [address]man [address], [place]  [address] [phone], [place] [address] [phone]  [facility] (Rehab for men only) [address] [place], [place] [address] [phone]   RESIDENTIAL TREATMENT- PRIVATE PAY:  [facility] [place], [place] [phone] [place], [place] [phone]  MEDICAL DETOX/RESIDENTIAL TREATMENT- MEDICAID/IPRS:  [place] [address] [place], [place] [address] [phone]  [place]. [place], [place] [address] [phone]  [place] [address] [place], [place] [address] [phone]  [place], [place] [address] [phone]  ((For admissions to these three [facility] facilities during weekday days and possibly other times, contact [place], phone: [phone]; fax: [phone]))  [facility] (detox for men and women) [place], [place] [address] [phone]  MEDICAL DETOX AND RESIDENTIAL TREATMENT - INSURANCE:  [facility] (also offers CD-IOP for graduates of residential program) [place]. [place], [place] [address] [phone]  [facility] (now accepting [place]) [place] [address] [phone]  [facility] (accept [facility] for some services) [address] [place], [place] [address] [phone]   OUTPATIENT PROGRAMS:  [facility] ([facility]) [place], [place] [address] [phone]  ((CD-IOP is currently not operational; Opioid replacement clinic is operational))   [facility] (private insurance) [place]. [address]. [place],  [address] [phone]  ((CD-IOP (afternoon program); individual therapy))   [facility] (Medicaid for [facility] and [employer]; some private  insurance) [address]  [place], [place] [address] [phone]  ((CD-IOP))   [facility] ([place] insurance) [phone] [address] [place], [place] [address] [phone]  ((CD-IOP (morning & evening programs); individual therapy))   HALFWAY HOUSES:  [facility] [phone]  [email or web address]

## 2021-12-31 ENCOUNTER — Ambulatory Visit: Payer: BC Managed Care – PPO | Admitting: Physician Assistant

## 2021-12-31 ENCOUNTER — Encounter: Payer: Self-pay | Admitting: Physician Assistant

## 2021-12-31 DIAGNOSIS — F3110 Bipolar disorder, current episode manic without psychotic features, unspecified: Secondary | ICD-10-CM

## 2021-12-31 DIAGNOSIS — F900 Attention-deficit hyperactivity disorder, predominantly inattentive type: Secondary | ICD-10-CM

## 2021-12-31 DIAGNOSIS — Z79899 Other long term (current) drug therapy: Secondary | ICD-10-CM | POA: Diagnosis not present

## 2021-12-31 MED ORDER — CARBAMAZEPINE 200 MG PO TABS: ORAL_TABLET | ORAL | 1 refills | Status: DC

## 2021-12-31 NOTE — Patient Instructions (Addendum)
1 in the morning and 2 in the evening for 4 days, then 2 twice a day.   Have lab drawn on either 8/9 or 8/10.
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
## 2022-01-11 LAB — CARBAMAZEPINE LEVEL, TOTAL: Carbamazepine (Tegretol), S: 10.1 ug/mL (ref 4.0–12.0)

## 2022-01-11 NOTE — Progress Notes (Signed)
Pt informed

## 2022-01-11 NOTE — Progress Notes (Signed)
Please let him know that the Tegretol level looks good, continue the carbamazepine 200 mg, 2 p.o. twice daily.  We will discuss further at his appointment next week.  Thank you.

## 2022-01-15 ENCOUNTER — Ambulatory Visit: Payer: BC Managed Care – PPO | Admitting: Physician Assistant

## 2022-01-15 ENCOUNTER — Encounter: Payer: Self-pay | Admitting: Physician Assistant

## 2022-01-15 VITALS — BP 137/76 | HR 77

## 2022-01-15 DIAGNOSIS — G4700 Insomnia, unspecified: Secondary | ICD-10-CM | POA: Diagnosis not present

## 2022-01-15 DIAGNOSIS — F3110 Bipolar disorder, current episode manic without psychotic features, unspecified: Secondary | ICD-10-CM | POA: Diagnosis not present

## 2022-01-15 DIAGNOSIS — F900 Attention-deficit hyperactivity disorder, predominantly inattentive type: Secondary | ICD-10-CM | POA: Diagnosis not present

## 2022-01-15 MED ORDER — CLONIDINE HCL 0.1 MG PO TABS: 0.1000 mg | ORAL_TABLET | Freq: Every day | ORAL | 1 refills | Status: DC

## 2022-01-15 NOTE — Progress Notes (Signed)
[facility]  Patient ID: Trevor Yoder,  MRN: [id number]  PCP: Pcp, No  Date of Evaluation: 01/15/2022 Time spent:30 minutes  Chief Complaint:  Chief Complaint   Follow-up    HISTORY/CURRENT STATUS: HPI  For routine med check.   At the last visit we increased the Tegretol.  He took 200 mg every morning and 400 mg every night for several days and then increase to 400 mg twice daily.  After the increase to 400 mg twice daily he was dizzy for 3 to 4 hours.  It was intolerable so he went back to the dose 200 mg every morning and 400 mg nightly.  Also when he got up to the 400 mg twice daily he was very flat emotionally, almost not caring about anything.  After dropping the dose the dizziness went away and his mood leveled off, he has been able to feel things again.  He likes the total of 600 mg daily and would like to stay there.  He has not had any episodes of anger, irritable outburst like he had prior to this last medication change.  He and his wife are both happy about that.  He is not impulsive.  He is sleeping well.  He denies increased talkativeness, racing thoughts, risky behaviors, increased spending, increased libido, grandiosity, paranoia, and no hallucinations.  Patient is able to enjoy things.  Energy and motivation are good.  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  That has been stressful but he is working through it.  No extreme sadness, tearfulness, or feelings of hopelessness.  Sleeps well most of the time. ADLs and personal hygiene are normal.  He is still having a hard time focusing on things, we started clonidine approximately 3 weeks ago but he is not sure if it is helping much or not.  Appetite has not changed.  Weight is stable.  No complaints of anxiety.  Denies suicidal or homicidal thoughts.  Denies dizziness, syncope, seizures, numbness, tingling, tremor, tics, unsteady gait, slurred speech, confusion. Denies muscle or joint pain,  stiffness, or dystonia.  Individual Medical History/ Review of Systems: Changes? :No      Past medications for mental health diagnoses include: Equetro, trazodone, Sonata didn't help, Melatonin, Luvox caused mania  Allergies: Luvox [fluvoxamine]  Current Medications:  Current Outpatient Medications:    b complex vitamins capsule, Take 1 capsule by mouth daily., Disp: , Rfl:    carbamazepine (TEGRETOL) 200 MG tablet, 1 po q am and 2 po qhs for 4 days, then increase to 2 po bid. (Patient taking differently: Take 200 mg by mouth daily. 1 po q am and 2 po qhs.), Disp: 180 tablet, Rfl: 1   cholecalciferol (VITAMIN D3) 25 MCG (1000 UNIT) tablet, Take 1,000 Units by mouth daily., Disp: , Rfl:    traZODone (DESYREL) 100 MG tablet, Take 2 tablets (200 mg total) by mouth at bedtime as needed. (Patient taking differently: Take 200 mg by mouth at bedtime as needed for sleep.), Disp: 180 tablet, Rfl: 1   cloNIDine (CATAPRES) 0.1 MG tablet, Take 1 tablet (0.1 mg total) by mouth at bedtime., Disp: 90 tablet, Rfl: 1 Medication Side Effects: none  Family Medical/ Social History: Changes? See HPI   MENTAL HEALTH EXAM:  Blood pressure 137/76, pulse 77.There is no height or weight on file to calculate BMI.  General Appearance: Casual, Neat and Well Groomed  Eye Contact:  Good  Speech:  Clear and Coherent and Normal Rate  Volume:  Normal  Mood:  Euthymic  Affect:  Congruent  Thought Process:  Goal Directed and Descriptions of Associations: Circumstantial  Orientation:  Full (Time, Place, and Person)  Thought Content: Logical   Suicidal Thoughts:  No  Homicidal Thoughts:  No  Memory:  WNL  Judgement:  Good  Insight:  Good  Psychomotor Activity:  Normal  Concentration:  Concentration: Fair and Attention Span: Fair  Recall:  Good  Fund of Knowledge: Good  Language: Good  Assets:  Desire for Improvement  ADL's:  Intact  Cognition: WNL  Prognosis:  Good   Labs  01/09/2022 Carbamazepine level  10.1 (on 400 mg twice daily.)  12/28/2021 CBC with differential normal, platelets 232 CMP sodium 131, chloride 95, glucose 107, kidney and liver functions normal Lipid panel total cholesterol 249, triglycerides 300, HDL 79, LDL 110 Hemoglobin A1c 5.1 TSH 1.1 Carbamazepine level 7.2 EtOH level less than 10  DIAGNOSES:    ICD-10-CM   1. Bipolar I disorder, most recent episode (or current) manic ([place])  F31.10     2. Attention deficit hyperactivity disorder (ADHD), predominantly inattentive type  F90.0     3. Insomnia, unspecified type  G47.00       Receiving Psychotherapy: Yes   [name]   RECOMMENDATIONS:  PDMP was reviewed.  No results available I provided 30 minutes of face to face time during this encounter, including time spent before and after the visit in records review, medical decision making, counseling pertinent to today's visit, and charting.   We discussed the Tegretol and the side effects he had.  It is fine to stay at 200 mg every morning and 400 mg at night.   We will give the clonidine more time to become effective and see how the ADD is in a couple of weeks.  May need to increase the dose but for now no change.  Continue Tegretol 200 mg, 1 po q am, 2 qhs.  Continue clonidine 0.1 mg, 1 p.o. nightly. Continue trazodone 100 mg, 2 p.o. nightly as needed sleep Continue therapy w/ [name] Return in 3 weeks  Donnal Moat, PA-C

## 2022-01-30 ENCOUNTER — Encounter: Payer: Self-pay | Admitting: *Deleted

## 2022-01-30 NOTE — Patient Instructions (Signed)
  Procedure: Colonoscopy  Estimated body mass index is 23.43 kg/m as calculated from the following:   Height as of this encounter: '5\' 11"'$  (1.803 m).   Weight as of this encounter: 168 lb (76.2 kg).   Have you had a colonoscopy before?  no  Do you have family history of colon cancer  no  Do you have a family history of polyps? no  Previous colonoscopy with polyps removed? no  Do you have a history colorectal cancer?   no  Are you diabetic?  no  Do you have a prosthetic or mechanical heart valve? no  Do you have a pacemaker/defibrillator?   no  Have you had endocarditis/atrial fibrillation?  no  Do you use supplemental oxygen/CPAP?  no  Have you had joint replacement within the last 12 months?  no  Do you tend to be constipated or have to use laxatives?  no   Do you have history of alcohol use? If yes, how much and how often.  Yes 2 drinks per day  Do you have history or are you using drugs? If yes, what do are you  using?  no  Have you ever had a stroke/heart attack?  no  Have you ever had a heart or other vascular stent placed,?no  Do you take weight loss medication? no  Do you take any blood-thinning medications such as: (Plavix, aspirin, Coumadin, Aggrenox, Brilinta, Xarelto, Eliquis, Pradaxa, Savaysa or Effient) mp  If yes we need the name, milligram, dosage and who is prescribing doctor:  n/a             Current Outpatient Medications  Medication Sig Dispense Refill   carbamazepine (TEGRETOL) 200 MG tablet 1 po q am and 2 po qhs for 4 days, then increase to 2 po bid. (Patient taking differently: Take 200 mg by mouth daily. 1 po q am and 2 po qhs.) 180 tablet 1   cloNIDine (CATAPRES) 0.1 MG tablet Take 1 tablet (0.1 mg total) by mouth at bedtime. 90 tablet 1   traZODone (DESYREL) 100 MG tablet Take 2 tablets (200 mg total) by mouth at bedtime as needed. (Patient taking differently: Take 200 mg by mouth at bedtime as needed for sleep.) 180 tablet 1   b complex  vitamins capsule Take 1 capsule by mouth daily.     cholecalciferol (VITAMIN D3) 25 MCG (1000 UNIT) tablet Take 1,000 Units by mouth daily.     No current facility-administered medications for this visit.    Allergies  Allergen Reactions   Luvox [Fluvoxamine] Other (See Comments)    Manic behavior

## 2022-01-31 NOTE — Progress Notes (Signed)
Pt wants October. Advised will call once we receive that schedule

## 2022-01-31 NOTE — Progress Notes (Signed)
ASA 2. Appropriate.  ?

## 2022-02-05 ENCOUNTER — Encounter: Payer: Self-pay | Admitting: Family Medicine

## 2022-02-05 ENCOUNTER — Ambulatory Visit: Payer: BC Managed Care – PPO | Admitting: Family Medicine

## 2022-02-05 VITALS — BP 154/82 | HR 76 | Ht 71.0 in | Wt 173.1 lb

## 2022-02-05 DIAGNOSIS — I10 Essential (primary) hypertension: Secondary | ICD-10-CM | POA: Diagnosis not present

## 2022-02-05 DIAGNOSIS — Z23 Encounter for immunization: Secondary | ICD-10-CM

## 2022-02-05 DIAGNOSIS — M778 Other enthesopathies, not elsewhere classified: Secondary | ICD-10-CM

## 2022-02-05 DIAGNOSIS — F323 Major depressive disorder, single episode, severe with psychotic features: Secondary | ICD-10-CM

## 2022-02-05 NOTE — Assessment & Plan Note (Signed)
Reports following up with his psychiatrist on 02/07/22 Denies SI and HI No complaints or difficulties with hand grip were reported today

## 2022-02-05 NOTE — Assessment & Plan Note (Addendum)
Reports not taking his clonidine 0.1 mg last night or today Elevated BP today He denies headaches, dizziness, and blurred vision No changes to BP regimen today, given that the patient did not take his medication today  Dash diet reviewed F/u in 1 month

## 2022-02-05 NOTE — Assessment & Plan Note (Signed)
No c/o of elbow pain and swelling was reported today Report that symptom is intermittent and flairs up after overuse of the affected elbow No recent trauma or injury was reported Encouraged resting the affected arm during flair-ups and use of otc analgesics

## 2022-02-05 NOTE — Patient Instructions (Addendum)
I appreciate the opportunity to provide care to you today!    Follow up:  1 months for BP   Please start taking fish oil 2000 mg BID for your cholesterol      Please continue to a heart-healthy diet and increase your physical activities. Try to exercise for 41mns at least three times a week.      It was a pleasure to see you and I look forward to continuing to work together on your health and well-being. Please do not hesitate to call the office if you need care or have questions about your care.   Have a wonderful day and week. With Gratitude, GAlvira MondayMSN, FNP-BC

## 2022-02-05 NOTE — Progress Notes (Signed)
Established Patient Office Visit  Subjective:  Patient ID: Trevor Yoder, male    DOB: Oct 25, 1965  Age: 56 y.o. MRN: [id number]  CC:  Chief Complaint  Patient presents with   Follow-up    2 month f/u. Pt c/o tendinitis on right elbow, he has flare ups here and there.     HPI Trevor Yoder is a 56 y.o. male with past medical history of depression, HTN, right elbow tendinitis presents for f/u of  chronic medical conditions. Right elbow tendinitis: reports increased repetitive motion of the affected arm with an intermittent flair-up of elbow swelling and pain. No signs of inflammation were reported. No pain or swelling was reported today. HTN: The patient doesn't remember if he took his BP medication last night or today. He reports having a verbal disagreement with his wife and going to sleep. His BP is elevated today, but he denies headaches, dizziness, and blurred vision. Depression: He is following up with his psychiatrist on 02/08/22.He reports increased depressive moods but denies SI/HI.  Past Medical History:  Diagnosis Date   Mood disorder [facility]     Past Surgical History:  Procedure Laterality Date   CERVICAL DISCECTOMY  2011    Family History  Problem Relation Age of Onset   Diabetes Mother    Hyperlipidemia Mother    Hypertension Mother    Heart disease Maternal Grandmother    Cancer Paternal Grandfather    Breast cancer Neg Hx     Social History   Socioeconomic History   Marital status: Married    Spouse name: Not on file   Number of children: Not on file   Years of education: 16   Highest education level: Not on file  Occupational History   Occupation: Programmer  Tobacco Use   Smoking status: Former    Types: Cigarettes    Quit date: 04/27/2009    Years since quitting: 12.7   Smokeless tobacco: Never  Substance and Sexual Activity   Alcohol use: Not Currently    Alcohol/week: 0.0 - 2.0 standard drinks of alcohol   Drug use: No   Sexual  activity: Yes    Partners: Female  Other Topics Concern   Not on file  Social History Narrative   Not on file   Social Determinants of Health   Financial Resource Strain: Not on file  Food Insecurity: Not on file  Transportation Needs: Not on file  Physical Activity: Not on file  Stress: Not on file  Social Connections: Not on file  Intimate Partner Violence: Not on file    Outpatient Medications Prior to Visit  Medication Sig Dispense Refill   b complex vitamins capsule Take 1 capsule by mouth daily.     carbamazepine (TEGRETOL) 200 MG tablet 1 po q am and 2 po qhs for 4 days, then increase to 2 po bid. (Patient taking differently: Take 200 mg by mouth daily. 1 po q am and 2 po qhs.) 180 tablet 1   cholecalciferol (VITAMIN D3) 25 MCG (1000 UNIT) tablet Take 1,000 Units by mouth daily.     cloNIDine (CATAPRES) 0.1 MG tablet Take 1 tablet (0.1 mg total) by mouth at bedtime. 90 tablet 1   traZODone (DESYREL) 100 MG tablet Take 2 tablets (200 mg total) by mouth at bedtime as needed. (Patient taking differently: Take 200 mg by mouth at bedtime as needed for sleep.) 180 tablet 1   No facility-administered medications prior to visit.    Allergies  Allergen  Reactions   Luvox [Fluvoxamine] Other (See Comments)    Manic behavior    ROS Review of Systems  Respiratory:  Negative for chest tightness and shortness of breath.   Gastrointestinal:  Negative for nausea and vomiting.  Musculoskeletal:        Right elbow pain  Skin:  Negative for rash and wound.  Neurological:  Negative for dizziness and headaches.  Psychiatric/Behavioral:  Negative for self-injury and suicidal ideas.       Objective:    Physical Exam Cardiovascular:     Rate and Rhythm: Normal rate and regular rhythm.     Pulses: Normal pulses.     Heart sounds: Normal heart sounds.  Pulmonary:     Effort: Pulmonary effort is normal.     Breath sounds: Normal breath sounds.  Musculoskeletal:     Right elbow:  No swelling, deformity, effusion or lacerations. Normal range of motion. No tenderness.     Left elbow: No swelling, deformity, effusion or lacerations. Normal range of motion. No tenderness.  Neurological:     Mental Status: He is alert.     BP (!) 154/82 (BP Location: Left Arm)   Pulse 76   Ht $R'5\' 11"'yc$  (1.803 m)   Wt 173 lb 1.3 oz (78.5 kg)   SpO2 96%   BMI 24.14 kg/m  Wt Readings from Last 3 Encounters:  02/05/22 173 lb 1.3 oz (78.5 kg)  01/30/22 168 lb (76.2 kg)  12/05/21 174 lb 12.8 oz (79.3 kg)    Lab Results  Component Value Date   TSH 1.183 12/28/2021   Lab Results  Component Value Date   WBC 7.5 12/28/2021   HGB 14.8 12/28/2021   HCT 40.4 12/28/2021   MCV 91.4 12/28/2021   PLT 232 12/28/2021   Lab Results  Component Value Date   NA 131 (L) 12/28/2021   K 4.1 12/28/2021   CO2 24 12/28/2021   GLUCOSE 107 (H) 12/28/2021   BUN 14 12/28/2021   CREATININE 0.78 12/28/2021   BILITOT 0.6 12/28/2021   ALKPHOS 45 12/28/2021   AST 20 12/28/2021   ALT 20 12/28/2021   PROT 6.5 12/28/2021   ALBUMIN 4.3 12/28/2021   CALCIUM 9.1 12/28/2021   ANIONGAP 12 12/28/2021   EGFR 105 12/07/2021   Lab Results  Component Value Date   CHOL 249 (H) 12/28/2021   Lab Results  Component Value Date   HDL 79 12/28/2021   Lab Results  Component Value Date   LDLCALC 110 (H) 12/28/2021   Lab Results  Component Value Date   TRIG 300 (H) 12/28/2021   Lab Results  Component Value Date   CHOLHDL 3.2 12/28/2021   Lab Results  Component Value Date   HGBA1C 5.1 12/28/2021      Assessment & Plan:   Problem List Items Addressed This Visit       Cardiovascular and Mediastinum   Hypertension    Reports not taking his clonidine 0.1 mg last night or today Elevated BP today He denies headaches, dizziness, and blurred vision No changes to BP regimen today, given that the patient did not take his medication today  Dash diet reviewed F/u in 1 month         Musculoskeletal  and Integument   Right elbow tendinitis    No c/o of elbow pain and swelling was reported today Report that symptom is intermittent and flairs up after overuse of the affected elbow No recent trauma or injury was reported Encouraged resting the  affected arm during flair-ups and use of otc analgesics            Other   Major depressive disorder, single episode with psychotic features ([place])    Reports following up with his psychiatrist on 02/07/22 Denies SI and HI No complaints or difficulties with hand grip were reported today      Other Visit Diagnoses     Immunization due    -  Primary   Relevant Orders   Flu Vaccine QUAD 6+ mos PF IM (Fluarix Quad PF) (Completed)       No orders of the defined types were placed in this encounter.   Follow-up: Return in about 1 month (around 03/07/2022) for BP.    Alvira Monday, FNP

## 2022-02-07 ENCOUNTER — Encounter: Payer: Self-pay | Admitting: *Deleted

## 2022-02-07 ENCOUNTER — Encounter: Payer: Self-pay | Admitting: Physician Assistant

## 2022-02-07 ENCOUNTER — Ambulatory Visit: Payer: BC Managed Care – PPO | Admitting: Physician Assistant

## 2022-02-07 VITALS — BP 141/96 | HR 79

## 2022-02-07 DIAGNOSIS — F900 Attention-deficit hyperactivity disorder, predominantly inattentive type: Secondary | ICD-10-CM

## 2022-02-07 DIAGNOSIS — F319 Bipolar disorder, unspecified: Secondary | ICD-10-CM | POA: Diagnosis not present

## 2022-02-07 DIAGNOSIS — F411 Generalized anxiety disorder: Secondary | ICD-10-CM | POA: Diagnosis not present

## 2022-02-07 DIAGNOSIS — F1020 Alcohol dependence, uncomplicated: Secondary | ICD-10-CM | POA: Diagnosis not present

## 2022-02-07 MED ORDER — CLONIDINE HCL 0.1 MG PO TABS: 0.1000 mg | ORAL_TABLET | Freq: Two times a day (BID) | ORAL | 1 refills | Status: DC

## 2022-02-07 MED ORDER — PEG 3350-KCL-NA BICARB-NACL 420 G PO SOLR: 4000.0000 mL | Freq: Once | ORAL | 0 refills | Status: AC

## 2022-02-07 NOTE — Progress Notes (Signed)
[facility]  Patient ID: DEJA KAIGLER,  MRN: [id number]  PCP: Alvira Monday, [place]  Date of Evaluation: 02/07/2022. Time spent:20 minutes  Chief Complaint:   HISTORY/CURRENT STATUS: HPI  For routine med check.   Had a relapse of alcohol a few weeks ago.  He had 1 beer at an outdoor function, tolerated that fine and the next day he did the same thing and it went on from there.  He had not been drinking over 2-3 drinks per 24 hours but it was enough for him to have a "blow up" 3 days ago with a big argument with his wife.  States he has learned his lesson.  He is going to lose his marriage if he does not stop.  States he is not craving alcohol, it is more of a ritual than craving.  He does feel guilty about drinking because he does not want to hurt his family.  He does have anxiety and likes to have a drink to help calm himself down.  Not having panic attacks but more of a generalized sense of unease.  Patient is able to enjoy things.  Energy and motivation are good.  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  That has been stressful.  No extreme sadness, tearfulness, or feelings of hopelessness.  Sleeps well.  ADLs and personal hygiene are normal.   Denies any changes remembering things.  Still has some trouble focusing but not as bad as before starting the clonidine.  Appetite has not changed.  Weight is stable.   Denies suicidal or homicidal thoughts.  The increased carbamazepine several months ago has been helpful.  Patient denies increased energy with decreased need for sleep, increased talkativeness, racing thoughts, impulsivity or risky behaviors, increased spending, increased libido, grandiosity, increased irritability or anger, paranoia, or hallucinations.  Blood pressure is elevated, even after being on clonidine for a few months which was prescribed for ADD.  It was checked at his PCP a few weeks ago, in high normal range.  See records on  chart.  Denies dizziness, syncope, seizures, numbness, tingling, tremor, tics, unsteady gait, slurred speech, confusion. Denies muscle or joint pain, stiffness, or dystonia.  Individual Medical History/ Review of Systems: Changes? :No      Past medications for mental health diagnoses include: Equetro, trazodone, Sonata didn't help, Melatonin, Luvox caused mania  Allergies: Luvox [fluvoxamine]  Current Medications:  Current Outpatient Medications:    b complex vitamins capsule, Take 1 capsule by mouth daily., Disp: , Rfl:    carbamazepine (TEGRETOL) 200 MG tablet, 1 po q am and 2 po qhs for 4 days, then increase to 2 po bid. (Patient taking differently: Take 200 mg by mouth daily. 1 po q am and 2 po qhs.), Disp: 180 tablet, Rfl: 1   cholecalciferol (VITAMIN D3) 25 MCG (1000 UNIT) tablet, Take 1,000 Units by mouth daily., Disp: , Rfl:    traZODone (DESYREL) 100 MG tablet, Take 2 tablets (200 mg total) by mouth at bedtime as needed. (Patient taking differently: Take 200 mg by mouth at bedtime as needed for sleep.), Disp: 180 tablet, Rfl: 1   cloNIDine (CATAPRES) 0.1 MG tablet, Take 1 tablet (0.1 mg total) by mouth 2 (two) times daily., Disp: 180 tablet, Rfl: 1   polyethylene glycol-electrolytes (NULYTELY) 420 g solution, Take 4,000 mLs by mouth once for 1 dose. (Patient not taking: Reported on 02/07/2022), Disp: 4000 mL, Rfl: 0 Medication Side Effects: none  Family Medical/ Social  History: Changes? See HPI   MENTAL HEALTH EXAM:  Blood pressure (!) 141/96, pulse 79.There is no height or weight on file to calculate BMI.  General Appearance: Casual, Neat and Well Groomed  Eye Contact:  Good  Speech:  Clear and Coherent and Normal Rate  Volume:  Normal  Mood:  Euthymic  Affect:  Congruent  Thought Process:  Goal Directed and Descriptions of Associations: Circumstantial  Orientation:  Full (Time, Place, and Person)  Thought Content: Logical   Suicidal Thoughts:  No  Homicidal Thoughts:  No   Memory:  WNL  Judgement:  Good  Insight:  Good  Psychomotor Activity:  Normal  Concentration:  Concentration: Good and Attention Span: Fair  Recall:  Good  Fund of Knowledge: Good  Language: Good  Assets:  Desire for Improvement  ADL's:  Intact  Cognition: WNL  Prognosis:  Good   Labs 01/09/2022 Carbamazepine level 10.1 (on 400 mg twice daily.)  12/28/2021 CBC with differential normal, platelets 232 CMP sodium 131, chloride 95, glucose 107, kidney and liver functions normal Lipid panel total cholesterol 249, triglycerides 300, HDL 79, LDL 110 Hemoglobin A1c 5.1 TSH 1.1 Carbamazepine level 7.2 EtOH level less than 10  DIAGNOSES:    ICD-10-CM   1. Bipolar I disorder ([place])  F31.9     2. Uncomplicated alcohol dependence ([place])  F10.20     3. Attention deficit hyperactivity disorder (ADHD), predominantly inattentive type  F90.0     4. Generalized anxiety disorder  F41.1       Receiving Psychotherapy: Yes   [name]  RECOMMENDATIONS:  PDMP was reviewed.  Ativan filled 07/10/2021. I provided 20 minutes of face to face time during this encounter, including time spent before and after the visit in records review, medical decision making, counseling pertinent to today's visit, and charting.   We discussed [place].  He is very hesitant about taking more meds.  He does not seem to be having alcohol cravings but more of a ritual when he wants to drink.  We decided not to add this medication and at this time, he can call if he feels like he needs it and I can send in a prescription. I recommend increasing the clonidine to twice daily, can help with anxiety and ADD symptoms but also with his blood pressure.  Continue Tegretol 200 mg, 1 po q am, 2 qhs.  Increase clonidine 0.1 mg to 1 p.o. twice daily. Continue trazodone 100 mg, 2 p.o. nightly as needed sleep. Continue multivitamin, vitamin D, B complex (stressed importance) and fish oil. Continue therapy w/ [name] Return in 2  months.  Donnal Moat, PA-C

## 2022-02-07 NOTE — Progress Notes (Signed)
Called pt. Scheduled for 10/11 at 7:30am. Aware will mail instructions and send rx for prep to pharmacy. Also aware to expect call from pre-op nurse.

## 2022-03-08 ENCOUNTER — Ambulatory Visit: Payer: BC Managed Care – PPO | Admitting: Family Medicine

## 2022-03-08 ENCOUNTER — Encounter: Payer: Self-pay | Admitting: Family Medicine

## 2022-03-08 VITALS — BP 136/84 | HR 70 | Ht 71.0 in | Wt 175.1 lb

## 2022-03-08 DIAGNOSIS — M778 Other enthesopathies, not elsewhere classified: Secondary | ICD-10-CM

## 2022-03-08 DIAGNOSIS — I10 Essential (primary) hypertension: Secondary | ICD-10-CM | POA: Diagnosis not present

## 2022-03-08 NOTE — Assessment & Plan Note (Signed)
Onset of symptoms 3 years ago  He denies pain, swelling, and decreased range of motion of the right elbow

## 2022-03-08 NOTE — Progress Notes (Signed)
Established Patient Office Visit  Subjective:  Patient ID: Trevor Yoder, male    DOB: [birth year]  Age: 56 y.o. MRN: [id number]  CC:  Chief Complaint  Patient presents with   Follow-up    1 month f/u , has been checking bp at home has been getting high readings around 150/90ish. Reports right elbow pain onset for about 3 years ago.     HPI Trevor Yoder is a 56 y.o. male with past medical history of Hypertension, Right elbow tendinitis and mood disorder presents for f/u of  chronic medical conditions.  Hypertension: He take clonidine 0.1 mg twice daily. He denies headaches, dizziness, blurred vision, chest pain, palpitations and SOB.  He reports decreasing his alcohol consumption to 1-2 drinks daily, he used to drink 2-3 bottles of beer daily and sometimes more.  Right elbow tendinitis: Onset of symptoms 3 years ago.  He denies pain, swelling, and decreased range of motion of the right elbow.  He reports having flareups only when he overeats or himself or lifts heavy objects.  He noted not taking over-the-counter analgesics for pain  Past Medical History:  Diagnosis Date   Mood disorder [facility])     Past Surgical History:  Procedure Laterality Date   CERVICAL DISCECTOMY  2011    Family History  Problem Relation Age of Onset   Diabetes Mother    Hyperlipidemia Mother    Hypertension Mother    Heart disease Maternal Grandmother    Cancer Paternal Grandfather    Breast cancer Neg Hx     Social History   Socioeconomic History   Marital status: Married    Spouse name: Not on file   Number of children: Not on file   Years of education: 16   Highest education level: Not on file  Occupational History   Occupation: Programmer  Tobacco Use   Smoking status: Former    Types: Cigarettes    Quit date: 04/27/2009    Years since quitting: 12.8   Smokeless tobacco: Never  Substance and Sexual Activity   Alcohol use: Not Currently    Comment: he drank 2 beer 02/04/2022    Drug use: No   Sexual activity: Yes    Partners: Female  Other Topics Concern   Not on file  Social History Narrative   Not on file   Social Determinants of Health   Financial Resource Strain: Not on file  Food Insecurity: Not on file  Transportation Needs: Not on file  Physical Activity: Not on file  Stress: Not on file  Social Connections: Not on file  Intimate Partner Violence: Not on file    Outpatient Medications Prior to Visit  Medication Sig Dispense Refill   aspirin EC 325 MG tablet Take 650 mg by mouth every 6 (six) hours as needed for moderate pain.     b complex vitamins capsule Take 1 capsule by mouth daily.     carbamazepine (TEGRETOL) 200 MG tablet 1 po q am and 2 po qhs for 4 days, then increase to 2 po bid. (Patient taking differently: Take 200-400 mg by mouth See admin instructions. Take 200 mg in the morning and 400 mg at night) 180 tablet 1   Cholecalciferol (VITAMIN D) 50 MCG (2000 UT) tablet Take 2,000 Units by mouth daily.     cloNIDine (CATAPRES) 0.1 MG tablet Take 1 tablet (0.1 mg total) by mouth 2 (two) times daily. 180 tablet 1   Omega-3 Fatty Acids (FISH OIL PO) Take  1 capsule by mouth daily.     traZODone (DESYREL) 100 MG tablet Take 2 tablets (200 mg total) by mouth at bedtime as needed. (Patient taking differently: Take 200 mg by mouth at bedtime.) 180 tablet 1   No facility-administered medications prior to visit.    Allergies  Allergen Reactions   Luvox [Fluvoxamine] Other (See Comments)    Manic behavior    ROS Review of Systems  Constitutional:  Negative for fatigue and fever.  Eyes:  Negative for visual disturbance.  Respiratory:  Negative for chest tightness and shortness of breath.   Musculoskeletal:  Negative for arthralgias and myalgias.  Neurological:  Negative for dizziness and headaches.      Objective:    Physical Exam HENT:     Head: Normocephalic.  Cardiovascular:     Rate and Rhythm: Normal rate and regular rhythm.      Pulses: Normal pulses.     Heart sounds: Normal heart sounds.  Pulmonary:     Effort: Pulmonary effort is normal.     Breath sounds: Normal breath sounds.  Musculoskeletal:     Right elbow: No swelling, deformity or effusion. Normal range of motion. No tenderness.     Left elbow: No swelling, deformity or effusion. Normal range of motion. No tenderness.  Neurological:     Mental Status: He is alert.     BP 136/84 (BP Location: Left Arm)   Pulse 70   Ht 5\' 11"  (1.803 m)   Wt 175 lb 1.3 oz (79.4 kg)   SpO2 96%   BMI 24.42 kg/m  Wt Readings from Last 3 Encounters:  03/08/22 175 lb 1.3 oz (79.4 kg)  02/05/22 173 lb 1.3 oz (78.5 kg)  01/30/22 168 lb (76.2 kg)    Lab Results  Component Value Date   TSH 1.183 12/28/2021   Lab Results  Component Value Date   WBC 7.5 12/28/2021   HGB 14.8 12/28/2021   HCT 40.4 12/28/2021   MCV 91.4 12/28/2021   PLT 232 12/28/2021   Lab Results  Component Value Date   NA 131 (L) 12/28/2021   K 4.1 12/28/2021   CO2 24 12/28/2021   GLUCOSE 107 (H) 12/28/2021   BUN 14 12/28/2021   CREATININE 0.78 12/28/2021   BILITOT 0.6 12/28/2021   ALKPHOS 45 12/28/2021   AST 20 12/28/2021   ALT 20 12/28/2021   PROT 6.5 12/28/2021   ALBUMIN 4.3 12/28/2021   CALCIUM 9.1 12/28/2021   ANIONGAP 12 12/28/2021   EGFR 105 12/07/2021   Lab Results  Component Value Date   CHOL 249 (H) 12/28/2021   Lab Results  Component Value Date   HDL 79 12/28/2021   Lab Results  Component Value Date   LDLCALC 110 (H) 12/28/2021   Lab Results  Component Value Date   TRIG 300 (H) 12/28/2021   Lab Results  Component Value Date   CHOLHDL 3.2 12/28/2021   Lab Results  Component Value Date   HGBA1C 5.1 12/28/2021      Assessment & Plan:   Problem List Items Addressed This Visit       Cardiovascular and Mediastinum   Hypertension - Primary    He take clonidine 0.1 mg twice daily He denies headaches, dizziness, blurred vision, chest pain,  palpitations and SOB He reports decreasing his alcohol consumption to 1-2 drinks daily, he used to drink 2-3 bottles of beer daily and sometimes more No changes to treatment regimen today Encouraged to continue on therapy  Musculoskeletal and Integument   Right elbow tendinitis    Onset of symptoms 3 years ago  He denies pain, swelling, and decreased range of motion of the right elbow       No orders of the defined types were placed in this encounter.   Follow-up: Return in about 4 months (around 07/09/2022).    Alvira Monday, FNP

## 2022-03-08 NOTE — Patient Instructions (Addendum)
I appreciate the opportunity to provide care to you today!    Follow up:  4 months  Please continue to a heart-healthy diet and increase your physical activities. Try to exercise for 30mins at least three times a week.      It was a pleasure to see you and I look forward to continuing to work together on your health and well-being. Please do not hesitate to call the office if you need care or have questions about your care.   Have a wonderful day and week. With Gratitude, Yamel Bale MSN, FNP-BC  

## 2022-03-08 NOTE — Assessment & Plan Note (Addendum)
He take clonidine 0.1 mg twice daily He denies headaches, dizziness, blurred vision, chest pain, palpitations and SOB He reports decreasing his alcohol consumption to 1-2 drinks daily, he used to drink 2-3 bottles of beer daily and sometimes more No changes to treatment regimen today Encouraged to continue on therapy

## 2022-03-11 ENCOUNTER — Encounter (HOSPITAL_COMMUNITY)
Admission: RE | Admit: 2022-03-11 | Discharge: 2022-03-11 | Disposition: A | Discharge status: Home / Self Care | Payer: BC Managed Care – PPO | Source: Ambulatory Visit | Attending: Internal Medicine | Admitting: Internal Medicine

## 2022-03-11 ENCOUNTER — Encounter (HOSPITAL_COMMUNITY): Payer: Self-pay

## 2022-03-11 ENCOUNTER — Other Ambulatory Visit: Payer: Self-pay

## 2022-03-11 VITALS — Ht 71.0 in | Wt 175.1 lb

## 2022-03-11 DIAGNOSIS — I10 Essential (primary) hypertension: Secondary | ICD-10-CM

## 2022-03-11 HISTORY — DX: Bipolar disorder, unspecified: F31.9

## 2022-03-13 ENCOUNTER — Ambulatory Visit (HOSPITAL_COMMUNITY)
Admission: RE | Admit: 2022-03-13 | Discharge: 2022-03-13 | Disposition: A | Discharge status: Home / Self Care | Payer: BC Managed Care – PPO | Attending: Internal Medicine | Admitting: Internal Medicine

## 2022-03-13 ENCOUNTER — Encounter (HOSPITAL_COMMUNITY): Payer: Self-pay

## 2022-03-13 ENCOUNTER — Ambulatory Visit (HOSPITAL_COMMUNITY): Payer: BC Managed Care – PPO | Admitting: Anesthesiology

## 2022-03-13 ENCOUNTER — Encounter (HOSPITAL_COMMUNITY)
Admission: RE | Disposition: A | Discharge status: Home / Self Care | Payer: Self-pay | Source: Home / Self Care | Attending: Internal Medicine

## 2022-03-13 DIAGNOSIS — Z79899 Other long term (current) drug therapy: Secondary | ICD-10-CM | POA: Diagnosis not present

## 2022-03-13 DIAGNOSIS — K635 Polyp of colon: Secondary | ICD-10-CM | POA: Diagnosis not present

## 2022-03-13 DIAGNOSIS — F319 Bipolar disorder, unspecified: Secondary | ICD-10-CM | POA: Diagnosis not present

## 2022-03-13 DIAGNOSIS — Z87891 Personal history of nicotine dependence: Secondary | ICD-10-CM | POA: Insufficient documentation

## 2022-03-13 DIAGNOSIS — F1020 Alcohol dependence, uncomplicated: Secondary | ICD-10-CM | POA: Insufficient documentation

## 2022-03-13 DIAGNOSIS — K648 Other hemorrhoids: Secondary | ICD-10-CM | POA: Insufficient documentation

## 2022-03-13 DIAGNOSIS — F411 Generalized anxiety disorder: Secondary | ICD-10-CM | POA: Diagnosis not present

## 2022-03-13 DIAGNOSIS — Z1211 Encounter for screening for malignant neoplasm of colon: Secondary | ICD-10-CM | POA: Insufficient documentation

## 2022-03-13 DIAGNOSIS — K621 Rectal polyp: Secondary | ICD-10-CM | POA: Insufficient documentation

## 2022-03-13 DIAGNOSIS — D128 Benign neoplasm of rectum: Secondary | ICD-10-CM | POA: Diagnosis not present

## 2022-03-13 DIAGNOSIS — Z1212 Encounter for screening for malignant neoplasm of rectum: Secondary | ICD-10-CM | POA: Diagnosis not present

## 2022-03-13 DIAGNOSIS — I10 Essential (primary) hypertension: Secondary | ICD-10-CM | POA: Insufficient documentation

## 2022-03-13 HISTORY — PX: COLONOSCOPY WITH PROPOFOL: SHX5780

## 2022-03-13 HISTORY — PX: POLYPECTOMY: SHX149

## 2022-03-13 SURGERY — COLONOSCOPY WITH PROPOFOL
Anesthesia: General

## 2022-03-13 MED ORDER — LIDOCAINE HCL (CARDIAC) PF 100 MG/5ML IV SOSY
PREFILLED_SYRINGE | INTRAVENOUS | Status: DC | PRN
  Administered 2022-03-13: 50 mg via INTRAVENOUS

## 2022-03-13 MED ORDER — LACTATED RINGERS IV SOLN: INTRAVENOUS | Status: DC

## 2022-03-13 MED ORDER — PROPOFOL 10 MG/ML IV BOLUS
INTRAVENOUS | Status: DC | PRN
  Administered 2022-03-13: 150 mg via INTRAVENOUS

## 2022-03-13 MED ORDER — PROPOFOL 500 MG/50ML IV EMUL
INTRAVENOUS | Status: DC | PRN
  Administered 2022-03-13: 200 ug/kg/min via INTRAVENOUS

## 2022-03-13 NOTE — Anesthesia Preprocedure Evaluation (Signed)
Anesthesia Evaluation  Patient identified by MRN, date of birth, ID band Patient awake    Reviewed: Allergy & Precautions, NPO status , Patient's Chart, lab work & pertinent test results  Airway Mallampati: II  TM Distance: >3 FB Neck ROM: Full    Dental  (+) Dental Advisory Given, Chipped,    Pulmonary neg pulmonary ROS, former smoker,    Pulmonary exam normal        Cardiovascular Exercise Tolerance: Good hypertension, Pt. on medications Normal cardiovascular exam Rhythm:Regular Rate:Normal     Neuro/Psych PSYCHIATRIC DISORDERS Anxiety Depression Bipolar Disorder negative neurological ROS     GI/Hepatic negative GI ROS, (+)     substance abuse  alcohol use,   Endo/Other  negative endocrine ROS  Renal/GU negative Renal ROS  negative genitourinary   Musculoskeletal negative musculoskeletal ROS (+)   Abdominal Normal abdominal exam  (+)   Peds negative pediatric ROS (+)  Hematology negative hematology ROS (+)   Anesthesia Other Findings Alcohol use disorder Alcohol use disorder, severe, dependence (HCC) Elevated triglycerides with high cholesterol Generalized anxiety disorder Hallux limitus Hypertension Insomnia Major depressive disorder, single episode with psychotic features (HCC) Mood disorder (HCC) Right elbow tendinitis    Reproductive/Obstetrics negative OB ROS                            Anesthesia Physical Anesthesia Plan  ASA: 2  Anesthesia Plan: General   Post-op Pain Management: Minimal or no pain anticipated   Induction: Intravenous  PONV Risk Score and Plan: Propofol infusion  Airway Management Planned: Nasal Cannula and Natural Airway  Additional Equipment:   Intra-op Plan:   Post-operative Plan:   Informed Consent: I have reviewed the patients History and Physical, chart, labs and discussed the procedure including the risks, benefits and alternatives  for the proposed anesthesia with the patient or authorized representative who has indicated his/her understanding and acceptance.     Dental advisory given  Plan Discussed with: CRNA and Surgeon  Anesthesia Plan Comments:         Anesthesia Quick Evaluation

## 2022-03-13 NOTE — Discharge Instructions (Addendum)
  Colonoscopy Discharge Instructions  Read the instructions outlined below and refer to this sheet in the next few weeks. These discharge instructions provide you with general information on caring for yourself after you leave the hospital. Your doctor may also give you specific instructions. While your treatment has been planned according to the most current medical practices available, unavoidable complications occasionally occur.   ACTIVITY You may resume your regular activity, but move at a slower pace for the next 24 hours.  Take frequent rest periods for the next 24 hours.  Walking will help get rid of the air and reduce the bloated feeling in your belly (abdomen).  No driving for 24 hours (because of the medicine (anesthesia) used during the test).   Do not sign any important legal documents or operate any machinery for 24 hours (because of the anesthesia used during the test).  NUTRITION Drink plenty of fluids.  You may resume your normal diet as instructed by your doctor.  Begin with a light meal and progress to your normal diet. Heavy or fried foods are harder to digest and may make you feel sick to your stomach (nauseated).  Avoid alcoholic beverages for 24 hours or as instructed.  MEDICATIONS You may resume your normal medications unless your doctor tells you otherwise.  WHAT YOU CAN EXPECT TODAY Some feelings of bloating in the abdomen.  Passage of more gas than usual.  Spotting of blood in your stool or on the toilet paper.  IF YOU HAD POLYPS REMOVED DURING THE COLONOSCOPY: No aspirin products for 7 days or as instructed.  No alcohol for 7 days or as instructed.  Eat a soft diet for the next 24 hours.  FINDING OUT THE RESULTS OF YOUR TEST Not all test results are available during your visit. If your test results are not back during the visit, make an appointment with your caregiver to find out the results. Do not assume everything is normal if you have not heard from your  caregiver or the medical facility. It is important for you to follow up on all of your test results.  SEEK IMMEDIATE MEDICAL ATTENTION IF: You have more than a spotting of blood in your stool.  Your belly is swollen (abdominal distention).  You are nauseated or vomiting.  You have a temperature over 101.  You have abdominal pain or discomfort that is severe or gets worse throughout the day.   Your colonoscopy revealed 3 polyp(s) which I removed successfully. One was greater then 1 cm in diameter. Await pathology results, my office will contact you. I recommend repeating colonoscopy in 3 years for surveillance purposes.    I hope you have a great rest of your week!  Trevor Yoder. Trevor Yoder, D.O. Gastroenterology and Hepatology [facility]

## 2022-03-13 NOTE — Op Note (Signed)
[facility] Patient Name: Trevor Yoder Procedure Date: 03/13/2022 9:08 AM MRN: [id number] Date of Birth: 02/12/1966 Attending MD: Elon Alas. Abbey Chatters DO CSN: [id number] Age: 56 Admit Type: Outpatient Procedure:                Colonoscopy Indications:              Screening for colorectal malignant neoplasm Providers:                Elon Alas. Abbey Chatters, DO, Crystal Page, Aram Candela Referring MD:              Medicines:                See the Anesthesia note for documentation of the                            administered medications Complications:            No immediate complications. Estimated Blood Loss:     Estimated blood loss was minimal. Procedure:                Pre-Anesthesia Assessment:                           - The anesthesia plan was to use monitored                            anesthesia care (MAC).                           After obtaining informed consent, the colonoscope                            was passed under direct vision. Throughout the                            procedure, the patient's blood pressure, pulse, and                            oxygen saturations were monitored continuously. The                            [id number]-HQ190L ([id number]) scope was introduced through                            the anus and advanced to the the cecum, identified                            by appendiceal orifice and ileocecal valve. The                            colonoscopy was performed without difficulty. The                            patient tolerated the procedure well. The quality                            of  the bowel preparation was evaluated using the                            BBPS [facility] Bowel Preparation Scale) with scores                            of: Right Colon = 3, Transverse Colon = 3 and Left                            Colon = 3 (entire mucosa seen well with no residual                            staining, small fragments of stool or opaque                             liquid). The total BBPS score equals 9. Scope In: 9:15:49 AM Scope Out: 9:31:32 AM Scope Withdrawal Time: 0 hours 11 minutes 22 seconds  Total Procedure Duration: 0 hours 15 minutes 43 seconds  Findings:      The perianal and digital rectal examinations were normal.      Non-bleeding internal hemorrhoids were found during endoscopy.      A 12 mm polyp was found in the ascending colon. The polyp was sessile.       The polyp was removed with a cold snare. Resection and retrieval were       complete.      A 3 mm polyp was found in the descending colon. The polyp was sessile.       The polyp was removed with a cold snare. Resection and retrieval were       complete.      A 8 mm polyp was found in the rectum. The polyp was sessile. The polyp       was removed with a cold snare. Resection and retrieval were complete. Impression:               - Non-bleeding internal hemorrhoids.                           - One 12 mm polyp in the ascending colon, removed                            with a cold snare. Resected and retrieved.                           - One 3 mm polyp in the descending colon, removed                            with a cold snare. Resected and retrieved.                           - One 8 mm polyp in the rectum, removed with a cold                            snare. Resected and retrieved. Moderate Sedation:      Per  Anesthesia Care Recommendation:           - Patient has a contact number available for                            emergencies. The signs and symptoms of potential                            delayed complications were discussed with the                            patient. Return to normal activities tomorrow.                            Written discharge instructions were provided to the                            patient.                           - Resume previous diet.                           - Continue present medications.                            - Await pathology results.                           - Repeat colonoscopy in 3 years for surveillance.                           - Return to GI clinic PRN. Procedure Code(s):        --- Professional ---                           [phone], Colonoscopy, flexible; with removal of                            tumor(s), polyp(s), or other lesion(s) by snare                            technique Diagnosis Code(s):        --- Professional ---                           Z12.11, Encounter for screening for malignant                            neoplasm of colon                           K63.5, Polyp of colon                           K62.1, Rectal polyp  K64.8, Other hemorrhoids CPT copyright 2019 American Medical Association. All rights reserved. The codes documented in this report are preliminary and upon coder review may  be revised to meet current compliance requirements. Elon Alas. Abbey Chatters, DO [place] Abbey Chatters, DO 03/13/2022 9:33:54 AM This report has been signed electronically. Number of Addenda: 0

## 2022-03-13 NOTE — Transfer of Care (Signed)
Immediate Anesthesia Transfer of Care Note  Patient: Trevor Yoder  Procedure(s) Performed: COLONOSCOPY WITH PROPOFOL POLYPECTOMY INTESTINAL  Patient Location: Short Stay  Anesthesia Type:General  Level of Consciousness: awake, alert  and oriented  Airway & Oxygen Therapy: Patient Spontanous Breathing  Post-op Assessment: Report given to RN and Post -op Vital signs reviewed and stable  Post vital signs: Reviewed and stable  Last Vitals:  Vitals Value Taken Time  BP 112/66 03/13/22 0936  Temp 36.6 C 03/13/22 0936  Pulse 60 03/13/22 0936  Resp 15 03/13/22 0936  SpO2 98 % 03/13/22 0936    Last Pain:  Vitals:   03/13/22 0936  TempSrc: Oral  PainSc: 0-No pain         Complications: No notable events documented.

## 2022-03-13 NOTE — Anesthesia Postprocedure Evaluation (Signed)
Anesthesia Post Note  Patient: Trevor Yoder  Procedure(s) Performed: COLONOSCOPY WITH PROPOFOL POLYPECTOMY INTESTINAL  Patient location during evaluation: Phase II Anesthesia Type: General Level of consciousness: awake and alert and oriented Pain management: pain level controlled Vital Signs Assessment: post-procedure vital signs reviewed and stable Respiratory status: spontaneous breathing, nonlabored ventilation and respiratory function stable Cardiovascular status: blood pressure returned to baseline and stable Postop Assessment: no apparent nausea or vomiting Anesthetic complications: no   No notable events documented.   Last Vitals:  Vitals:   03/13/22 0805 03/13/22 0936  BP: (!) 143/99 112/66  Pulse: 65 60  Resp: 16 15  Temp: 36.7 C 36.6 C  SpO2: 100% 98%    Last Pain:  Vitals:   03/13/22 0936  TempSrc: Oral  PainSc: 0-No pain                 Lido Maske C Anaiah Mcmannis

## 2022-03-13 NOTE — H&P (Signed)
Primary Care Physician:  Alvira Monday, [place] Primary Gastroenterologist:  Dr. Abbey Chatters  Pre-Procedure History & Physical: HPI:  Trevor Yoder is a 56 y.o. male is here for first ever colonoscopy for colon cancer screening purposes.  Patient denies any family history of colorectal cancer.  No melena or hematochezia.  No abdominal pain or unintentional weight loss.  No change in bowel habits.  Overall feels well from a GI standpoint.  Past Medical History:  Diagnosis Date   Bipolar disorder ([place])    Mood disorder [facility])     Past Surgical History:  Procedure Laterality Date   CERVICAL DISCECTOMY  2011    Prior to Admission medications   Medication Sig Start Date End Date Taking? Authorizing Provider  aspirin EC 325 MG tablet Take 650 mg by mouth every 6 (six) hours as needed for moderate pain.   Yes [provider]  b complex vitamins capsule Take 1 capsule by mouth daily.   Yes [provider]  carbamazepine (TEGRETOL) 200 MG tablet 1 po q am and 2 po qhs for 4 days, then increase to 2 po bid. Patient taking differently: Take 200-400 mg by mouth See admin instructions. Take 200 mg in the morning and 400 mg at night 12/31/21  Yes Hurst, Teresa T, PA-C  Cholecalciferol (VITAMIN D) 50 MCG (2000 UT) tablet Take 2,000 Units by mouth daily.   Yes [provider]  cloNIDine (CATAPRES) 0.1 MG tablet Take 1 tablet (0.1 mg total) by mouth 2 (two) times daily. 02/07/22  Yes Hurst, Helene Kelp T, PA-C  Omega-3 Fatty Acids (FISH OIL PO) Take 1 capsule by mouth daily.   Yes [provider]  traZODone (DESYREL) 100 MG tablet Take 2 tablets (200 mg total) by mouth at bedtime as needed. Patient taking differently: Take 200 mg by mouth at bedtime. 12/21/21  Yes Donnal Moat T, PA-C    Allergies as of 02/07/2022 - Review Complete 02/07/2022  Allergen Reaction Noted   Luvox [fluvoxamine] Other (See Comments) 12/21/2021    Family History  Problem Relation Age of Onset    Diabetes Mother    Hyperlipidemia Mother    Hypertension Mother    Heart disease Maternal Grandmother    Cancer Paternal Grandfather    Breast cancer Neg Hx     Social History   Socioeconomic History   Marital status: Married    Spouse name: Not on file   Number of children: Not on file   Years of education: 16   Highest education level: Not on file  Occupational History   Occupation: Scientist, research (physical sciences)  Tobacco Use   Smoking status: Former    Packs/day: 0.50    Years: 26.00    Total pack years: 13.00    Types: Cigarettes    Quit date: 04/27/2009    Years since quitting: 12.8   Smokeless tobacco: Never  Vaping Use   Vaping Use: Never used  Substance and Sexual Activity   Alcohol use: Not Currently    Alcohol/week: 14.0 standard drinks of alcohol    Types: 14 Cans of beer per week   Drug use: No   Sexual activity: Yes    Partners: Female  Other Topics Concern   Not on file  Social History Narrative   Not on file   Social Determinants of Health   Financial Resource Strain: Not on file  Food Insecurity: Not on file  Transportation Needs: Not on file  Physical Activity: Not on file  Stress: Not on file  Social Connections: Not on file  Intimate Partner Violence: Not on file    Review of Systems: See HPI, otherwise negative ROS  Physical Exam: Vital signs in last 24 hours: Temp:  [98 F (36.7 C)] 98 F (36.7 C) (10/11 0805) Pulse Rate:  [65] 65 (10/11 0805) Resp:  [16] 16 (10/11 0805) BP: (143)/(99) 143/99 (10/11 0805) SpO2:  [100 %] 100 % (10/11 0805) Weight:  [79.4 kg] 79.4 kg (10/11 0805)   General:   Alert,  Well-developed, well-nourished, pleasant and cooperative in NAD Head:  Normocephalic and atraumatic. Eyes:  Sclera clear, no icterus.   Conjunctiva pink. Ears:  Normal auditory acuity. Nose:  No deformity, discharge,  or lesions. Mouth:  No deformity or lesions, dentition normal. Neck:  Supple; no masses or thyromegaly. Lungs:  Clear throughout to  auscultation.   No wheezes, crackles, or rhonchi. No acute distress. Heart:  Regular rate and rhythm; no murmurs, clicks, rubs,  or gallops. Abdomen:  Soft, nontender and nondistended. No masses, hepatosplenomegaly or hernias noted. Normal bowel sounds, without guarding, and without rebound.   Msk:  Symmetrical without gross deformities. Normal posture. Extremities:  Without clubbing or edema. Neurologic:  Alert and  oriented x4;  grossly normal neurologically. Skin:  Intact without significant lesions or rashes. Cervical Nodes:  No significant cervical adenopathy. Psych:  Alert and cooperative. Normal mood and affect.  Trevor Yoder is here for a colonoscopy to be performed for colon cancer screening purposes.  The risks of the procedure including infection, bleed, or perforation as well as benefits, limitations, alternatives and imponderables have been reviewed with the patient. Questions have been answered. All parties agreeable.

## 2022-03-14 LAB — SURGICAL PATHOLOGY

## 2022-03-19 ENCOUNTER — Encounter (HOSPITAL_COMMUNITY): Payer: Self-pay | Admitting: Internal Medicine

## 2022-04-04 ENCOUNTER — Encounter: Payer: Self-pay | Admitting: Internal Medicine

## 2022-04-04 ENCOUNTER — Telehealth: Payer: Self-pay | Admitting: Family Medicine

## 2022-04-04 ENCOUNTER — Ambulatory Visit: Payer: BC Managed Care – PPO | Admitting: Internal Medicine

## 2022-04-04 DIAGNOSIS — U071 COVID-19: Secondary | ICD-10-CM

## 2022-04-04 MED ORDER — MOLNUPIRAVIR EUA 200MG CAPSULE: 4.0000 | ORAL_CAPSULE | Freq: Two times a day (BID) | ORAL | 0 refills | Status: AC

## 2022-04-04 MED ORDER — NIRMATRELVIR/RITONAVIR (PAXLOVID)TABLET: 3.0000 | ORAL_TABLET | Freq: Two times a day (BID) | ORAL | 0 refills | Status: DC

## 2022-04-04 NOTE — Patient Instructions (Signed)
Please take molnupiravir as prescribed.  Okay to take Mucinex or Robitussin as needed for cough.  Please use nasal saline spray as needed for nasal congestion.

## 2022-04-04 NOTE — Telephone Encounter (Signed)
Patient called in regard to molnupiravir EUA (LAGEVRIO) 200 mg CAPS capsule   Pharm has npt received prescription

## 2022-04-04 NOTE — Progress Notes (Signed)
Virtual Visit via Telephone Note   This visit type was conducted via telephone. This format is felt to be most appropriate for this patient at this time.  The patient did not have access to video technology/had technical difficulties with video requiring transitioning to audio format only (telephone).  All issues noted in this document were discussed and addressed.  No physical exam could be performed with this format.  Evaluation Performed:  Follow-up visit  Date:  04/04/2022   ID:  Trevor Yoder, DOB [birth year], MRN [id number]  Patient Location: Home Provider Location: Office/Clinic  Participants: Patient Location of Patient: Home Location of Provider: Telehealth Consent was obtain for visit to be over via telehealth. I verified that I am speaking with the correct person using two identifiers.  PCP:  Trevor Yoder, Trevor Yoder   Chief Complaint: Cough, nasal congestion and headache  History of Present Illness:    Trevor Yoder is a 56 y.o. male who has a televisit for complaint of cough, nasal congestion, sinus pressure related headache and postnasal drip for the last 2 days.  His home COVID test was positive today. Denies any dyspnea or wheezing.  He feels warm, but has not checked his temperature.  He has had 4 doses of COVID vaccines, last booster dose about 14 months ago.  The patient does have symptoms concerning for COVID-19 infection (fever, chills, cough, or new shortness of breath).   Past Medical, Surgical, Social History, Allergies, and Medications have been Reviewed.  Past Medical History:  Diagnosis Date   Bipolar disorder ([place])    Mood disorder [facility])    Past Surgical History:  Procedure Laterality Date   CERVICAL DISCECTOMY  2011   COLONOSCOPY WITH PROPOFOL N/A 03/13/2022   Procedure: COLONOSCOPY WITH PROPOFOL;  Surgeon: Eloise Harman, DO;  [facility];  Service: Endoscopy;  Laterality: N/A;  7:30am, asa 2   POLYPECTOMY  03/13/2022    Procedure: POLYPECTOMY INTESTINAL;  Surgeon: Eloise Harman, DO;  Location: [facility];  Service: Endoscopy;;     Current Meds  Medication Sig   aspirin EC 325 MG tablet Take 650 mg by mouth every 6 (six) hours as needed for moderate pain.   b complex vitamins capsule Take 1 capsule by mouth daily.   carbamazepine (TEGRETOL) 200 MG tablet 1 po q am and 2 po qhs for 4 days, then increase to 2 po bid. (Patient taking differently: Take 200-400 mg by mouth See admin instructions. Take 200 mg in the morning and 400 mg at night)   Cholecalciferol (VITAMIN D) 50 MCG (2000 UT) tablet Take 2,000 Units by mouth daily.   cloNIDine (CATAPRES) 0.1 MG tablet Take 1 tablet (0.1 mg total) by mouth 2 (two) times daily.   Omega-3 Fatty Acids (FISH OIL PO) Take 1 capsule by mouth daily.   traZODone (DESYREL) 100 MG tablet Take 2 tablets (200 mg total) by mouth at bedtime as needed. (Patient taking differently: Take 200 mg by mouth at bedtime.)     Allergies:   Luvox [fluvoxamine]   ROS:   Please see the history of present illness.     All other systems reviewed and are negative.   Labs/Other Tests and Data Reviewed:    Recent Labs: 12/28/2021: ALT 20; BUN 14; Creatinine, Ser 0.78; Hemoglobin 14.8; Platelets 232; Potassium 4.1; Sodium 131; TSH 1.183   Recent Lipid Panel Lab Results  Component Value Date/Time   CHOL 249 (H) 12/28/2021 03:12 PM  CHOL 238 (H) 12/07/2021 08:43 AM   TRIG 300 (H) 12/28/2021 03:12 PM   HDL 79 12/28/2021 03:12 PM   HDL 86 12/07/2021 08:43 AM   CHOLHDL 3.2 12/28/2021 03:12 PM   LDLCALC 110 (H) 12/28/2021 03:12 PM   LDLCALC 136 (H) 12/07/2021 08:43 AM    Wt Readings from Last 3 Encounters:  03/13/22 175 lb 1.3 oz (79.4 kg)  03/11/22 175 lb 1.3 oz (79.4 kg)  03/08/22 175 lb 1.3 oz (79.4 kg)     ASSESSMENT & PLAN:    COVID 19 infection Started molnupiravir Avoid Paxlovid as he is on Tegretol Mucinex or Robitussin as needed for cough Nasal saline spray as  needed for nasal congestion   Time:   Today, I have spent 11 minutes reviewing the chart, including problem list, medications, and with the patient with telehealth technology discussing the above problems.   Medication Adjustments/Labs and Tests Ordered: Current medicines are reviewed at length with the patient today.  Concerns regarding medicines are outlined above.   Tests Ordered: No orders of the defined types were placed in this encounter.   Medication Changes: No orders of the defined types were placed in this encounter.    Note: This dictation was prepared with Dragon dictation along with smaller phrase technology. Similar sounding words can be transcribed inadequately or may not be corrected upon review. Any transcriptional errors that result from this process are unintentional.      Disposition:  Follow up  Signed, Lindell Spar, MD  04/04/2022 10:52 AM     [place]

## 2022-04-18 ENCOUNTER — Ambulatory Visit: Payer: BC Managed Care – PPO | Admitting: Physician Assistant

## 2022-05-09 ENCOUNTER — Ambulatory Visit: Payer: BC Managed Care – PPO | Admitting: Physician Assistant

## 2022-05-13 ENCOUNTER — Telehealth: Payer: Self-pay | Admitting: Physician Assistant

## 2022-05-13 ENCOUNTER — Other Ambulatory Visit: Payer: Self-pay

## 2022-05-13 MED ORDER — CARBAMAZEPINE 200 MG PO TABS: ORAL_TABLET | ORAL | 0 refills | Status: DC

## 2022-05-13 NOTE — Telephone Encounter (Signed)
Rx sent 

## 2022-05-13 NOTE — Telephone Encounter (Signed)
Next visit is 06/12/22 for [place]. Trevor Yoder doesn't not have any pills left and would please like his Carbamezapine 200 mg called in today. Pharmacy is:  [facility] [place], [place]   RCBUL:[phone]:[phone]

## 2022-06-12 ENCOUNTER — Encounter: Payer: Self-pay | Admitting: Physician Assistant

## 2022-06-12 ENCOUNTER — Ambulatory Visit: Payer: BC Managed Care – PPO | Admitting: Physician Assistant

## 2022-06-12 VITALS — BP 139/91 | HR 68

## 2022-06-12 DIAGNOSIS — F900 Attention-deficit hyperactivity disorder, predominantly inattentive type: Secondary | ICD-10-CM | POA: Diagnosis not present

## 2022-06-12 DIAGNOSIS — F319 Bipolar disorder, unspecified: Secondary | ICD-10-CM | POA: Diagnosis not present

## 2022-06-12 DIAGNOSIS — F411 Generalized anxiety disorder: Secondary | ICD-10-CM

## 2022-06-12 DIAGNOSIS — G4700 Insomnia, unspecified: Secondary | ICD-10-CM | POA: Diagnosis not present

## 2022-06-12 MED ORDER — TRAZODONE HCL 100 MG PO TABS: 200.0000 mg | ORAL_TABLET | Freq: Every evening | ORAL | 1 refills | Status: DC | PRN

## 2022-06-12 NOTE — Progress Notes (Signed)
[facility] Check  Patient ID: Trevor Yoder,  MRN: [id number]  PCP: Alvira Monday, [place]  Date of Evaluation: 06/12/2022 Time spent:20 minutes  Chief Complaint:  Chief Complaint   Anxiety; Depression; Insomnia; Follow-up    HISTORY/CURRENT STATUS: HPI  For routine med check.   Things are going really well.  He and his wife are getting along well.  He has limited his alcohol to 2 beers per day and that is it.  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  He is currently in training.  Likes that a lot and not having the stress of having his own business to manage.  Patient is able to enjoy things.  Energy and motivation are good.  No extreme sadness, tearfulness, or feelings of hopelessness.  Sleeps well most of the time. ADLs and personal hygiene are normal.   Appetite has not changed.  Weight is stable.  Denies suicidal or homicidal thoughts.  He continues to have trouble focusing and staying on task but does not feel that it is any worse than it ever has been.  Not sure the clonidine has helped that very much.  But he is not as anxious as he had been, and not wanting to drink as much so it may be helping in that regard.  Patient denies increased energy with decreased need for sleep, increased talkativeness, racing thoughts, impulsivity or risky behaviors, increased spending, increased libido, grandiosity, increased irritability or anger, paranoia, or hallucinations.  Denies dizziness, syncope, seizures, numbness, tingling, tremor, tics, unsteady gait, slurred speech, confusion. Denies muscle or joint pain, stiffness, or dystonia.  Individual Medical History/ Review of Systems: Changes? :No      Past medications for mental health diagnoses include: Equetro, trazodone, Sonata didn't help, Melatonin, Luvox caused mania  Allergies: Luvox [fluvoxamine]  Current Medications:  Current Outpatient  Medications:    b complex vitamins capsule, Take 1 capsule by mouth daily., Disp: , Rfl:    carbamazepine (TEGRETOL) 200 MG tablet, Take 200 mg in the morning and 400 mg at night, Disp: 270 tablet, Rfl: 0   Cholecalciferol (VITAMIN D) 50 MCG (2000 UT) tablet, Take 2,000 Units by mouth daily., Disp: , Rfl:    cloNIDine (CATAPRES) 0.1 MG tablet, Take 1 tablet (0.1 mg total) by mouth 2 (two) times daily., Disp: 180 tablet, Rfl: 1   Omega-3 Fatty Acids (FISH OIL PO), Take 1 capsule by mouth daily., Disp: , Rfl:    aspirin EC 325 MG tablet, Take 650 mg by mouth every 6 (six) hours as needed for moderate pain. (Patient not taking: Reported on 06/12/2022), Disp: , Rfl:    traZODone (DESYREL) 100 MG tablet, Take 2 tablets (200 mg total) by mouth at bedtime as needed., Disp: 180 tablet, Rfl: 1 Medication Side Effects: none  Family Medical/ Social History: Changes? See HPI   MENTAL HEALTH EXAM:  Blood pressure (!) 139/91, pulse 68.There is no height or weight on file to calculate BMI.  General Appearance: Casual, Neat and Well Groomed  Eye Contact:  Good  Speech:  Clear and Coherent and Normal Rate  Volume:  Normal  Mood:  Euthymic  Affect:  Congruent  Thought Process:  Goal Directed and Descriptions of Associations: Circumstantial  Orientation:  Full (Time, Place, and Person)  Thought Content: Logical   Suicidal Thoughts:  No  Homicidal Thoughts:  No  Memory:  WNL  Judgement:  Good  Insight:  Good  Psychomotor Activity:  Normal  Concentration:  Concentration: Good and Attention Span: Fair  Recall:  Good  Fund of Knowledge: Good  Language: Good  Assets:  Desire for Improvement Financial Resources/Insurance Housing Transportation Vocational/Educational  ADL's:  Intact  Cognition: WNL  Prognosis:  Good   DIAGNOSES:    ICD-10-CM   1. Bipolar I disorder ([place])  F31.9     2. Generalized anxiety disorder  F41.1     3. Attention deficit hyperactivity disorder (ADHD), predominantly  inattentive type  F90.0     4. Insomnia, unspecified type  G47.00       Receiving Psychotherapy: Yes   [name]  RECOMMENDATIONS:  PDMP was reviewed.  Ativan filled 07/10/2021. I provided 20 minutes of face to face time during this encounter, including time spent before and after the visit in records review, medical decision making, counseling pertinent to today's visit, and charting.   We discussed the clonidine.  I do think it is helping with the anxiety and sleep, although not much with the focus and attention.  I recommend he stay on 0.1 mg twice daily but if he is noticing worsening of his attention span and getting distracted too much at work once he is out on his own then we could try increasing the dose.  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  He verbalizes understanding.  Continue Tegretol 200 mg, 1 po q am, 2 qhs.  Continue clonidine 0.1 mg, 1 p.o. twice daily. Continue trazodone 100 mg, 2 p.o. nightly as needed sleep. Continue multivitamin, vitamin D, B complex (stressed importance) and fish oil. Continue therapy w/ [name] Return in 6 months.  Donnal Moat, PA-C

## 2022-07-10 ENCOUNTER — Ambulatory Visit: Payer: BC Managed Care – PPO | Admitting: Family Medicine

## 2022-07-30 ENCOUNTER — Encounter: Payer: Self-pay | Admitting: Family Medicine

## 2022-07-30 ENCOUNTER — Ambulatory Visit: Payer: BC Managed Care – PPO | Admitting: Family Medicine

## 2022-07-30 VITALS — BP 148/90 | HR 62 | Ht 71.0 in | Wt 184.0 lb

## 2022-07-30 DIAGNOSIS — R7301 Impaired fasting glucose: Secondary | ICD-10-CM | POA: Diagnosis not present

## 2022-07-30 DIAGNOSIS — I10 Essential (primary) hypertension: Secondary | ICD-10-CM

## 2022-07-30 DIAGNOSIS — E559 Vitamin D deficiency, unspecified: Secondary | ICD-10-CM | POA: Diagnosis not present

## 2022-07-30 DIAGNOSIS — E038 Other specified hypothyroidism: Secondary | ICD-10-CM | POA: Diagnosis not present

## 2022-07-30 DIAGNOSIS — E7849 Other hyperlipidemia: Secondary | ICD-10-CM

## 2022-07-30 MED ORDER — OLMESARTAN MEDOXOMIL 20 MG PO TABS: 20.0000 mg | ORAL_TABLET | Freq: Every day | ORAL | 1 refills | Status: DC

## 2022-07-30 NOTE — Patient Instructions (Addendum)
I appreciate the opportunity to provide care to you today!    Follow up:  1 month for BP  Labs: please stop by the lab today to get your blood drawn (CBC, CMP, TSH, Lipid profile, HgA1c, Vit D)  Please pick up your prescription at the pharmacy and start therapy I recommend taking each dose at the same time each day, with or without flu High blood pressure often has no symptoms; it is important to take this medication as often as directed for optimal benefit In the first few days of therapy, you may experience dizziness/lightheadedness from your body adjusting to lower pressure (this is expected) I recommend checking your blood pressure daily and bringing your readings with you at your next visit I want your blood pressure to be less than 140/90 I recommend a low-sodium diet with increased physical activity I recommend increasing your fluid consumption by at least 64 ounces daily I recommend a daily sodium intake of less than 1500 mg    Please continue to a heart-healthy diet and increase your physical activities. Try to exercise for 39mns at least five times a week.      It was a pleasure to see you and I look forward to continuing to work together on your health and well-being. Please do not hesitate to call the office if you need care or have questions about your care.   Have a wonderful day and week. With Gratitude, GAlvira MondayMSN, FNP-BC

## 2022-07-30 NOTE — Assessment & Plan Note (Signed)
Uncontrolled Reports that his blood pressure has been elevated since his last visit Patient is asymptomatic We will start the patient on antihypertensive today Educated patient on symptomatic hypotension Low-sodium diet with increased physical activity encouraged BP Readings from Last 3 Encounters:  07/30/22 (!) 148/90  06/12/22 (!) 139/91  03/13/22 112/66

## 2022-07-30 NOTE — Progress Notes (Signed)
Established Patient Office Visit  Subjective:  Patient ID: Trevor Yoder, male    DOB: [birth year]  Age: 57 y.o. MRN: [id number]  CC:  Chief Complaint  Patient presents with   Follow-up    4 month f/u.    HPI Trevor Yoder is a 57 y.o. male with past medical history of primary hypertension, mood disorder, insomnia, generalized anxiety disorder presents for f/u of  chronic medical conditions. For the details of today's visit, please refer to the assessment and plan.       Past Medical History:  Diagnosis Date   Bipolar disorder ([place])    Mood disorder [facility])     Past Surgical History:  Procedure Laterality Date   CERVICAL DISCECTOMY  2011   COLONOSCOPY WITH PROPOFOL N/A 03/13/2022   Procedure: COLONOSCOPY WITH PROPOFOL;  Surgeon: Eloise Harman, DO;  [facility];  Service: Endoscopy;  Laterality: N/A;  7:30am, asa 2   POLYPECTOMY  03/13/2022   Procedure: POLYPECTOMY INTESTINAL;  Surgeon: Eloise Harman, DO;  [facility];  Service: Endoscopy;;    Family History  Problem Relation Age of Onset   Diabetes Mother    Hyperlipidemia Mother    Hypertension Mother    Heart disease Maternal Grandmother    Cancer Paternal Grandfather    Breast cancer Neg Hx     Social History   Socioeconomic History   Marital status: Married    Spouse name: Not on file   Number of children: Not on file   Years of education: 16   Highest education level: Not on file  Occupational History   Occupation: Programmer  Tobacco Use   Smoking status: Former    Packs/day: 0.50    Years: 26.00    Total pack years: 13.00    Types: Cigarettes    Quit date: 04/27/2009    Years since quitting: 13.2   Smokeless tobacco: Never  Vaping Use   Vaping Use: Never used  Substance and Sexual Activity   Alcohol use: Not Currently    Alcohol/week: 14.0 standard drinks of alcohol    Types: 14 Cans of beer per week   Drug use: No   Sexual activity: Yes    Partners:  Female  Other Topics Concern   Not on file  Social History Narrative   Not on file   Social Determinants of Health   Financial Resource Strain: Not on file  Food Insecurity: Not on file  Transportation Needs: Not on file  Physical Activity: Not on file  Stress: Not on file  Social Connections: Not on file  Intimate Partner Violence: Not on file    Outpatient Medications Prior to Visit  Medication Sig Dispense Refill   b complex vitamins capsule Take 1 capsule by mouth daily.     carbamazepine (TEGRETOL) 200 MG tablet Take 200 mg in the morning and 400 mg at night 270 tablet 0   Cholecalciferol (VITAMIN D) 50 MCG (2000 UT) tablet Take 2,000 Units by mouth daily.     Omega-3 Fatty Acids (FISH OIL PO) Take 1 capsule by mouth daily.     traZODone (DESYREL) 100 MG tablet Take 2 tablets (200 mg total) by mouth at bedtime as needed. 180 tablet 1   cloNIDine (CATAPRES) 0.1 MG tablet Take 1 tablet (0.1 mg total) by mouth 2 (two) times daily. 180 tablet 1   aspirin EC 325 MG tablet Take 650 mg by mouth every 6 (six) hours as needed for  moderate pain. (Patient not taking: Reported on 07/30/2022)     No facility-administered medications prior to visit.    Allergies  Allergen Reactions   Luvox [Fluvoxamine] Other (See Comments)    Manic behavior    ROS Review of Systems  Constitutional:  Negative for fatigue and fever.  Eyes:  Negative for visual disturbance.  Respiratory:  Negative for chest tightness and shortness of breath.   Cardiovascular:  Negative for chest pain and palpitations.  Neurological:  Negative for dizziness and headaches.      Objective:    Physical Exam HENT:     Head: Normocephalic.     Right Ear: External ear normal.     Left Ear: External ear normal.     Nose: No congestion or rhinorrhea.     Mouth/Throat:     Mouth: Mucous membranes are moist.  Cardiovascular:     Rate and Rhythm: Regular rhythm.     Heart sounds: No murmur heard. Pulmonary:      Effort: No respiratory distress.     Breath sounds: Normal breath sounds.  Neurological:     Mental Status: He is alert.     BP (!) 148/90 (BP Location: Left Arm)   Pulse 62   Ht '5\' 11"'$  (1.803 m)   Wt 184 lb (83.5 kg)   SpO2 98%   BMI 25.66 kg/m  Wt Readings from Last 3 Encounters:  07/30/22 184 lb (83.5 kg)  03/13/22 175 lb 1.3 oz (79.4 kg)  03/11/22 175 lb 1.3 oz (79.4 kg)    Lab Results  Component Value Date   TSH 1.183 12/28/2021   Lab Results  Component Value Date   WBC 7.5 12/28/2021   HGB 14.8 12/28/2021   HCT 40.4 12/28/2021   MCV 91.4 12/28/2021   PLT 232 12/28/2021   Lab Results  Component Value Date   NA 131 (L) 12/28/2021   K 4.1 12/28/2021   CO2 24 12/28/2021   GLUCOSE 107 (H) 12/28/2021   BUN 14 12/28/2021   CREATININE 0.78 12/28/2021   BILITOT 0.6 12/28/2021   ALKPHOS 45 12/28/2021   AST 20 12/28/2021   ALT 20 12/28/2021   PROT 6.5 12/28/2021   ALBUMIN 4.3 12/28/2021   CALCIUM 9.1 12/28/2021   ANIONGAP 12 12/28/2021   EGFR 105 12/07/2021   Lab Results  Component Value Date   CHOL 249 (H) 12/28/2021   Lab Results  Component Value Date   HDL 79 12/28/2021   Lab Results  Component Value Date   LDLCALC 110 (H) 12/28/2021   Lab Results  Component Value Date   TRIG 300 (H) 12/28/2021   Lab Results  Component Value Date   CHOLHDL 3.2 12/28/2021   Lab Results  Component Value Date   HGBA1C 5.1 12/28/2021      Assessment & Plan:  Primary hypertension Assessment & Plan: Uncontrolled Reports that his blood pressure has been elevated since his last visit Patient is asymptomatic We will start the patient on antihypertensive today Educated patient on symptomatic hypotension Low-sodium diet with increased physical activity encouraged BP Readings from Last 3 Encounters:  07/30/22 (!) 148/90  06/12/22 (!) 139/91  03/13/22 112/66     Orders: -     Olmesartan Medoxomil; Take 1 tablet (20 mg total) by mouth daily.  Dispense: 30  tablet; Refill: 1  IFG (impaired fasting glucose) -     Hemoglobin A1c  Vitamin D deficiency -     VITAMIN D 25 Hydroxy (Vit-D Deficiency, Fractures)  Other specified hypothyroidism -     TSH + free T4  Other hyperlipidemia -     Lipid panel -     CMP14+EGFR -     CBC with Differential/Platelet    Follow-up: Return in about 1 month (around 08/28/2022) for BP.   Alvira Monday, FNP

## 2022-07-31 LAB — CMP14+EGFR
ALT: 18 IU/L (ref 0–44)
AST: 17 IU/L (ref 0–40)
Albumin/Globulin Ratio: 2.7 — ABNORMAL HIGH (ref 1.2–2.2)
Albumin: 4.8 g/dL (ref 3.8–4.9)
Alkaline Phosphatase: 58 IU/L (ref 44–121)
BUN/Creatinine Ratio: 14 (ref 9–20)
BUN: 10 mg/dL (ref 6–24)
Bilirubin Total: 0.5 mg/dL (ref 0.0–1.2)
CO2: 25 mmol/L (ref 20–29)
Calcium: 8.9 mg/dL (ref 8.7–10.2)
Chloride: 90 mmol/L — ABNORMAL LOW (ref 96–106)
Creatinine, Ser: 0.71 mg/dL — ABNORMAL LOW (ref 0.76–1.27)
Globulin, Total: 1.8 g/dL (ref 1.5–4.5)
Glucose: 90 mg/dL (ref 70–99)
Potassium: 4.2 mmol/L (ref 3.5–5.2)
Sodium: 128 mmol/L — ABNORMAL LOW (ref 134–144)
Total Protein: 6.6 g/dL (ref 6.0–8.5)
eGFR: 108 mL/min/{1.73_m2} (ref >59)

## 2022-07-31 LAB — CBC WITH DIFFERENTIAL/PLATELET
Basophils Absolute: 0 10*3/uL (ref 0.0–0.2)
Basos: 1 %
EOS (ABSOLUTE): 0.1 10*3/uL (ref 0.0–0.4)
Eos: 2 %
Hematocrit: 40.6 % (ref 37.5–51.0)
Hemoglobin: 14.8 g/dL (ref 13.0–17.7)
Immature Grans (Abs): 0 10*3/uL (ref 0.0–0.1)
Immature Granulocytes: 0 %
Lymphocytes Absolute: 1.4 10*3/uL (ref 0.7–3.1)
Lymphs: 33 %
MCH: 33.5 pg — ABNORMAL HIGH (ref 26.6–33.0)
MCHC: 36.5 g/dL — ABNORMAL HIGH (ref 31.5–35.7)
MCV: 92 fL (ref 79–97)
Monocytes Absolute: 0.5 10*3/uL (ref 0.1–0.9)
Monocytes: 13 %
Neutrophils Absolute: 2.1 10*3/uL (ref 1.4–7.0)
Neutrophils: 51 %
Platelets: 228 10*3/uL (ref 150–450)
RBC: 4.42 x10E6/uL (ref 4.14–5.80)
RDW: 12.2 % (ref 11.6–15.4)
WBC: 4.1 10*3/uL (ref 3.4–10.8)

## 2022-07-31 LAB — VITAMIN D 25 HYDROXY (VIT D DEFICIENCY, FRACTURES): Vit D, 25-Hydroxy: 26.4 ng/mL — ABNORMAL LOW (ref 30.0–100.0)

## 2022-07-31 LAB — TSH+FREE T4
Free T4: 1.05 ng/dL (ref 0.82–1.77)
TSH: 2 u[IU]/mL (ref 0.450–4.500)

## 2022-07-31 LAB — LIPID PANEL
Chol/HDL Ratio: 2.8 ratio (ref 0.0–5.0)
Cholesterol, Total: 224 mg/dL — ABNORMAL HIGH (ref 100–199)
HDL: 81 mg/dL (ref >39)
LDL Chol Calc (NIH): 128 mg/dL — ABNORMAL HIGH (ref 0–99)
Triglycerides: 85 mg/dL (ref 0–149)
VLDL Cholesterol Cal: 15 mg/dL (ref 5–40)

## 2022-07-31 LAB — HEMOGLOBIN A1C
Est. average glucose Bld gHb Est-mCnc: 108 mg/dL
Hgb A1c MFr Bld: 5.4 % (ref 4.8–5.6)

## 2022-08-11 ENCOUNTER — Other Ambulatory Visit: Payer: Self-pay | Admitting: Physician Assistant

## 2022-08-28 ENCOUNTER — Encounter: Payer: Self-pay | Admitting: Family Medicine

## 2022-08-28 ENCOUNTER — Ambulatory Visit: Payer: BC Managed Care – PPO | Admitting: Family Medicine

## 2022-08-28 DIAGNOSIS — I10 Essential (primary) hypertension: Secondary | ICD-10-CM | POA: Diagnosis not present

## 2022-08-28 MED ORDER — OLMESARTAN MEDOXOMIL 20 MG PO TABS: 20.0000 mg | ORAL_TABLET | Freq: Every day | ORAL | 1 refills | Status: DC

## 2022-08-28 NOTE — Patient Instructions (Addendum)
I appreciate the opportunity to provide care to you today!    Follow up:  3 months  Labs: please stop by the lab today to get your blood drawn (BMP)     Please continue to a heart-healthy diet and increase your physical activities. Try to exercise for 92mins at least five days a week.      It was a pleasure to see you and I look forward to continuing to work together on your health and well-being. Please do not hesitate to call the office if you need care or have questions about your care.   Have a wonderful day and week. With Gratitude, Alvira Monday MSN, FNP-BC

## 2022-08-28 NOTE — Assessment & Plan Note (Signed)
Controlled He takes olmesartan 20 mg daily Denies headaches, dizziness, blurred vision, chest pain, palpitation and shortness of breath He reports adherence to lifestyle modification of low-sodium diet and increase physical activity Will assess BMP today Encouraged to continue treatment regiment We will follow-up in 3 months

## 2022-08-28 NOTE — Progress Notes (Signed)
bmp  Established Patient Office Visit  Subjective:  Patient ID: Trevor Yoder, male    DOB: Apr 26, 1966  Age: 57 y.o. MRN: [id number]  CC:  Chief Complaint  Patient presents with   Follow-up    Htn follow up    HPI Trevor Yoder is a 57 y.o. male with past medical history of hypertension presents for blood pressure follow-up. For the details of today's visit, please refer to the assessment and plan.     Past Medical History:  Diagnosis Date   Bipolar disorder ([place])    Mood disorder [facility])     Past Surgical History:  Procedure Laterality Date   CERVICAL DISCECTOMY  2011   COLONOSCOPY WITH PROPOFOL N/A 03/13/2022   Procedure: COLONOSCOPY WITH PROPOFOL;  Surgeon: Eloise Harman, DO;  [facility];  Service: Endoscopy;  Laterality: N/A;  7:30am, asa 2   POLYPECTOMY  03/13/2022   Procedure: POLYPECTOMY INTESTINAL;  Surgeon: Eloise Harman, DO;  Location: [facility];  Service: Endoscopy;;    Family History  Problem Relation Age of Onset   Diabetes Mother    Hyperlipidemia Mother    Hypertension Mother    Heart disease Maternal Grandmother    Cancer Paternal Grandfather    Breast cancer Neg Hx     Social History   Socioeconomic History   Marital status: Married    Spouse name: Not on file   Number of children: Not on file   Years of education: 16   Highest education level: Not on file  Occupational History   Occupation: Programmer  Tobacco Use   Smoking status: Former    Packs/day: 0.50    Years: 26.00    Additional pack years: 0.00    Total pack years: 13.00    Types: Cigarettes    Quit date: 04/27/2009    Years since quitting: 13.3   Smokeless tobacco: Never  Vaping Use   Vaping Use: Never used  Substance and Sexual Activity   Alcohol use: Not Currently    Alcohol/week: 14.0 standard drinks of alcohol    Types: 14 Cans of beer per week   Drug use: No   Sexual activity: Yes    Partners: Female  Other Topics Concern   Not on  file  Social History Narrative   Not on file   Social Determinants of Health   Financial Resource Strain: Not on file  Food Insecurity: Not on file  Transportation Needs: Not on file  Physical Activity: Not on file  Stress: Not on file  Social Connections: Not on file  Intimate Partner Violence: Not on file    Outpatient Medications Prior to Visit  Medication Sig Dispense Refill   aspirin EC 325 MG tablet Take 650 mg by mouth every 6 (six) hours as needed for moderate pain.     b complex vitamins capsule Take 1 capsule by mouth daily.     carbamazepine (TEGRETOL) 200 MG tablet TAKE 1 TABLET BY MOUTH EVERY MORNING AND 2 TABLETS BY MOUTH EVERY EVENING 270 tablet 0   Cholecalciferol (VITAMIN D) 50 MCG (2000 UT) tablet Take 2,000 Units by mouth daily.     Omega-3 Fatty Acids (FISH OIL PO) Take 1 capsule by mouth daily.     traZODone (DESYREL) 100 MG tablet Take 2 tablets (200 mg total) by mouth at bedtime as needed. 180 tablet 1   olmesartan (BENICAR) 20 MG tablet Take 1 tablet (20 mg total) by mouth daily. 30 tablet 1  No facility-administered medications prior to visit.    Allergies  Allergen Reactions   Luvox [Fluvoxamine] Other (See Comments)    Manic behavior    ROS Review of Systems  Constitutional:  Negative for fatigue and fever.  Eyes:  Negative for visual disturbance.  Respiratory:  Negative for chest tightness and shortness of breath.   Cardiovascular:  Negative for chest pain and palpitations.  Neurological:  Negative for dizziness and headaches.      Objective:    Physical Exam HENT:     Head: Normocephalic.     Right Ear: External ear normal.     Left Ear: External ear normal.     Nose: No congestion or rhinorrhea.     Mouth/Throat:     Mouth: Mucous membranes are moist.  Cardiovascular:     Rate and Rhythm: Regular rhythm.     Heart sounds: No murmur heard. Pulmonary:     Effort: No respiratory distress.     Breath sounds: Normal breath sounds.   Neurological:     Mental Status: He is alert.     BP 128/82   Pulse 76   Ht 5\' 11"  (1.803 m)   Wt 181 lb 1.9 oz (82.2 kg)   SpO2 96%   BMI 25.26 kg/m  Wt Readings from Last 3 Encounters:  08/28/22 181 lb 1.9 oz (82.2 kg)  07/30/22 184 lb (83.5 kg)  03/13/22 175 lb 1.3 oz (79.4 kg)    Lab Results  Component Value Date   TSH 2.000 07/30/2022   Lab Results  Component Value Date   WBC 4.1 07/30/2022   HGB 14.8 07/30/2022   HCT 40.6 07/30/2022   MCV 92 07/30/2022   PLT 228 07/30/2022   Lab Results  Component Value Date   NA 128 (L) 07/30/2022   K 4.2 07/30/2022   CO2 25 07/30/2022   GLUCOSE 90 07/30/2022   BUN 10 07/30/2022   CREATININE 0.71 (L) 07/30/2022   BILITOT 0.5 07/30/2022   ALKPHOS 58 07/30/2022   AST 17 07/30/2022   ALT 18 07/30/2022   PROT 6.6 07/30/2022   ALBUMIN 4.8 07/30/2022   CALCIUM 8.9 07/30/2022   ANIONGAP 12 12/28/2021   EGFR 108 07/30/2022   Lab Results  Component Value Date   CHOL 224 (H) 07/30/2022   Lab Results  Component Value Date   HDL 81 07/30/2022   Lab Results  Component Value Date   LDLCALC 128 (H) 07/30/2022   Lab Results  Component Value Date   TRIG 85 07/30/2022   Lab Results  Component Value Date   CHOLHDL 2.8 07/30/2022   Lab Results  Component Value Date   HGBA1C 5.4 07/30/2022      Assessment & Plan:  Primary hypertension Assessment & Plan: Controlled He takes olmesartan 20 mg daily Denies headaches, dizziness, blurred vision, chest pain, palpitation and shortness of breath He reports adherence to lifestyle modification of low-sodium diet and increase physical activity Will assess BMP today Encouraged to continue treatment regiment We will follow-up in 3 months  Orders: -     Olmesartan Medoxomil; Take 1 tablet (20 mg total) by mouth daily.  Dispense: 90 tablet; Refill: 1 -     BMP8+EGFR    Follow-up: Return in about 3 months (around 11/28/2022).   Alvira Monday, FNP

## 2022-08-30 LAB — BMP8+EGFR
BUN/Creatinine Ratio: 22 — ABNORMAL HIGH (ref 9–20)
BUN: 15 mg/dL (ref 6–24)
CO2: 25 mmol/L (ref 20–29)
Calcium: 9.1 mg/dL (ref 8.7–10.2)
Chloride: 90 mmol/L — ABNORMAL LOW (ref 96–106)
Creatinine, Ser: 0.67 mg/dL — ABNORMAL LOW (ref 0.76–1.27)
Glucose: 93 mg/dL (ref 70–99)
Potassium: 4.4 mmol/L (ref 3.5–5.2)
Sodium: 128 mmol/L — ABNORMAL LOW (ref 134–144)
eGFR: 110 mL/min/{1.73_m2} (ref >59)

## 2022-09-01 NOTE — Progress Notes (Signed)
Please inform the patient that his potassium and kidney function are stable.

## 2022-10-07 ENCOUNTER — Other Ambulatory Visit: Payer: Self-pay | Admitting: Family Medicine

## 2022-10-15 ENCOUNTER — Ambulatory Visit: Payer: BC Managed Care – PPO | Admitting: Internal Medicine

## 2022-10-15 ENCOUNTER — Encounter: Payer: Self-pay | Admitting: Internal Medicine

## 2022-10-15 VITALS — BP 156/88 | HR 70 | Ht 71.0 in | Wt 178.8 lb

## 2022-10-15 DIAGNOSIS — H6691 Otitis media, unspecified, right ear: Secondary | ICD-10-CM | POA: Insufficient documentation

## 2022-10-15 MED ORDER — AMOXICILLIN-POT CLAVULANATE 875-125 MG PO TABS: 1.0000 | ORAL_TABLET | Freq: Two times a day (BID) | ORAL | 0 refills | Status: AC

## 2022-10-15 NOTE — Progress Notes (Signed)
   Acute Office Visit  Subjective:     Patient ID: Trevor Yoder, male    DOB: 12/01/1965, 57 y.o.   MRN: [id number]  Chief Complaint  Patient presents with   Ear Fullness    Patient states he has fluid build up in his ear for a couple days now, started to be painful today   Trevor Yoder presents for an acute visit today endorsing a recent history of right ear fullness and discomfort.  He states that his symptoms began with fullness in the right ear, for which he tried cleaning his ear with hydrogen peroxide.  This did not alleviate his symptoms and led to irritation developing in his right ear over the last 2 days.  He denies fever/chills and endorses decreased hearing in the right ear.  Review of Systems  HENT:  Positive for ear pain (Right ear) and hearing loss (Decreased hearing in the right ear).       Objective:    BP (!) 156/88   Pulse 70   Ht 5\' 11"  (1.803 m)   Wt 178 lb 12.8 oz (81.1 kg)   SpO2 98%   BMI 24.94 kg/m   Physical Exam HENT:     Right Ear: Decreased hearing noted. A middle ear effusion is present. There is no impacted cerumen. Tympanic membrane is erythematous and bulging.     Left Ear: Hearing, tympanic membrane, ear canal and external ear normal. There is no impacted cerumen.       Assessment & Plan:   Problem List Items Addressed This Visit       Acute otitis media, right - Primary    Presenting today for evaluation of right ear fullness and discomfort.  On exam there is a middle ear effusion with erythema noted in the ear canal.  Concerning for acute otitis media. -Augmentin x 7 days prescribed -He will return to care if his symptoms worsen or fail to improve.  Otherwise he is scheduled for routine follow-up with his PCP next month.       Meds ordered this encounter  Medications   amoxicillin-clavulanate (AUGMENTIN) 875-125 MG tablet    Sig: Take 1 tablet by mouth 2 (two) times daily for 7 days.    Dispense:  14 tablet    Refill:  0     Return if symptoms worsen or fail to improve.  Billie Lade, MD

## 2022-10-15 NOTE — Assessment & Plan Note (Signed)
Presenting today for evaluation of right ear fullness and discomfort.  On exam there is a middle ear effusion with erythema noted in the ear canal.  Concerning for acute otitis media. -Augmentin x 7 days prescribed -He will return to care if his symptoms worsen or fail to improve.  Otherwise he is scheduled for routine follow-up with his PCP next month.

## 2022-10-15 NOTE — Patient Instructions (Signed)
It was a pleasure to see you today.  Thank you for giving [name] the opportunity to be involved in your care.  Below is a brief recap of your visit and next steps.  We will plan to see you again in June.  Summary Start Augmentin twice daily x 7 days for infection of the right ear Follow up with Trevor Yoder as scheduled in June but return to care if your symptoms are not improving.

## 2022-11-08 ENCOUNTER — Other Ambulatory Visit: Payer: Self-pay | Admitting: Adult Health

## 2022-11-26 ENCOUNTER — Ambulatory Visit: Payer: BC Managed Care – PPO | Admitting: Family Medicine

## 2022-11-29 ENCOUNTER — Ambulatory Visit: Payer: BC Managed Care – PPO | Admitting: Family Medicine

## 2022-12-03 ENCOUNTER — Encounter: Payer: Self-pay | Admitting: Family Medicine

## 2022-12-03 ENCOUNTER — Ambulatory Visit: Payer: BC Managed Care – PPO | Admitting: Family Medicine

## 2022-12-03 VITALS — BP 128/74 | HR 76 | Ht 71.0 in | Wt 169.1 lb

## 2022-12-03 DIAGNOSIS — R7301 Impaired fasting glucose: Secondary | ICD-10-CM

## 2022-12-03 DIAGNOSIS — F411 Generalized anxiety disorder: Secondary | ICD-10-CM

## 2022-12-03 DIAGNOSIS — E559 Vitamin D deficiency, unspecified: Secondary | ICD-10-CM | POA: Diagnosis not present

## 2022-12-03 DIAGNOSIS — E7849 Other hyperlipidemia: Secondary | ICD-10-CM

## 2022-12-03 DIAGNOSIS — I10 Essential (primary) hypertension: Secondary | ICD-10-CM

## 2022-12-03 DIAGNOSIS — E038 Other specified hypothyroidism: Secondary | ICD-10-CM

## 2022-12-03 MED ORDER — OLMESARTAN MEDOXOMIL 20 MG PO TABS: 20.0000 mg | ORAL_TABLET | Freq: Every day | ORAL | 1 refills | Status: DC

## 2022-12-03 NOTE — Patient Instructions (Signed)
I appreciate the opportunity to provide care to you today!    Follow up:  4 months  Labs: please stop by the lab  during the week  to get your blood drawn (CBC, CMP, TSH, Lipid profile, HgA1c, Vit D)  -Please pick up your refills at the pharmacy    Please continue to a heart-healthy diet and increase your physical activities. Try to exercise for 30mins at least five days a week.      It was a pleasure to see you and I look forward to continuing to work together on your health and well-being. Please do not hesitate to call the office if you need care or have questions about your care.   Have a wonderful day and week. With Gratitude, Sarissa Dern MSN, FNP-BC  

## 2022-12-03 NOTE — Assessment & Plan Note (Signed)
Controlled He takes olmesartan 20 mg daily Denies headaches, dizziness, blurred vision, chest pain, palpitation and shortness of breath He reports adherence to lifestyle modification of low-sodium diet and increase physical activity Will assess BMP today Encouraged to continue treatment regimen We will follow-up in 3 months BP Readings from Last 3 Encounters:  12/03/22 128/74  10/15/22 (!) 156/88  08/28/22 128/82

## 2022-12-03 NOTE — Assessment & Plan Note (Signed)
GAD-7 is 0 PHQ-9 is 0 Denies suicidal thoughts and ideation

## 2022-12-03 NOTE — Progress Notes (Signed)
Established Patient Office Visit  Subjective:  Patient ID: PONO MONTAN, male    DOB: 11/03/1965  Age: 57 y.o. MRN: [id number]  CC:  Chief Complaint  Patient presents with   Chronic Care Management    3 month f/u    HPI Trevor Yoder is a 57 y.o. male with past medical history of hypertension generalized anxiety disorder presents for f/u of  chronic medical conditions. For the details of today's visit, please refer to the assessment and plan.     Past Medical History:  Diagnosis Date   Bipolar disorder ([facility])    Mood disorder [facility])     Past Surgical History:  Procedure Laterality Date   CERVICAL DISCECTOMY  2011   COLONOSCOPY WITH PROPOFOL N/A 03/13/2022   Procedure: COLONOSCOPY WITH PROPOFOL;  Surgeon: Lanelle Bal, DO;  [facility];  Service: Endoscopy;  Laterality: N/A;  7:30am, asa 2   POLYPECTOMY  03/13/2022   Procedure: POLYPECTOMY INTESTINAL;  Surgeon: Lanelle Bal, DO;  [facility];  Service: Endoscopy;;    Family History  Problem Relation Age of Onset   Diabetes Mother    Hyperlipidemia Mother    Hypertension Mother    Heart disease Maternal Grandmother    Cancer Paternal Grandfather    Breast cancer Neg Hx     Social History   Socioeconomic History   Marital status: Married    Spouse name: Not on file   Number of children: Not on file   Years of education: 16   Highest education level: Not on file  Occupational History   Occupation: Programmer  Tobacco Use   Smoking status: Former    Packs/day: 0.50    Years: 26.00    Additional pack years: 0.00    Total pack years: 13.00    Types: Cigarettes    Quit date: 04/27/2009    Years since quitting: 13.6   Smokeless tobacco: Never  Vaping Use   Vaping Use: Never used  Substance and Sexual Activity   Alcohol use: Not Currently    Alcohol/week: 14.0 standard drinks of alcohol    Types: 14 Cans of beer per week   Drug use: No   Sexual activity: Yes     Partners: Female  Other Topics Concern   Not on file  Social History Narrative   Not on file   Social Determinants of Health   Financial Resource Strain: Not on file  Food Insecurity: Not on file  Transportation Needs: Not on file  Physical Activity: Not on file  Stress: Not on file  Social Connections: Not on file  Intimate Partner Violence: Not on file    Outpatient Medications Prior to Visit  Medication Sig Dispense Refill   aspirin EC 325 MG tablet Take 650 mg by mouth every 6 (six) hours as needed for moderate pain.     b complex vitamins capsule Take 1 capsule by mouth daily.     carbamazepine (TEGRETOL) 200 MG tablet TAKE 1 TABLET BY MOUTH EVERY MORNING AND 2 TABLET BY MOUTH EVERY EVENING 270 tablet 0   Cholecalciferol (VITAMIN D) 50 MCG (2000 UT) tablet Take 2,000 Units by mouth daily.     Omega-3 Fatty Acids (FISH OIL PO) Take 1 capsule by mouth daily.     traZODone (DESYREL) 100 MG tablet Take 2 tablets (200 mg total) by mouth at bedtime as needed. 180 tablet 1   olmesartan (BENICAR) 20 MG tablet Take 1 tablet (20 mg  total) by mouth daily. 90 tablet 1   No facility-administered medications prior to visit.    Allergies  Allergen Reactions   Luvox [Fluvoxamine] Other (See Comments)    Manic behavior    ROS Review of Systems  Constitutional:  Negative for fatigue and fever.  Eyes:  Negative for visual disturbance.  Respiratory:  Negative for chest tightness and shortness of breath.   Cardiovascular:  Negative for chest pain and palpitations.  Neurological:  Negative for dizziness and headaches.      Objective:    Physical Exam HENT:     Head: Normocephalic.     Right Ear: External ear normal.     Left Ear: External ear normal.     Nose: No congestion or rhinorrhea.     Mouth/Throat:     Mouth: Mucous membranes are moist.  Cardiovascular:     Rate and Rhythm: Regular rhythm.     Heart sounds: No murmur heard. Pulmonary:     Effort: No respiratory  distress.     Breath sounds: Normal breath sounds.  Neurological:     Mental Status: He is alert.     BP 128/74   Pulse 76   Ht 5\' 11"  (1.803 m)   Wt 169 lb 1.3 oz (76.7 kg)   SpO2 96%   BMI 23.58 kg/m  Wt Readings from Last 3 Encounters:  12/03/22 169 lb 1.3 oz (76.7 kg)  10/15/22 178 lb 12.8 oz (81.1 kg)  08/28/22 181 lb 1.9 oz (82.2 kg)    Lab Results  Component Value Date   TSH 2.000 07/30/2022   Lab Results  Component Value Date   WBC 4.1 07/30/2022   HGB 14.8 07/30/2022   HCT 40.6 07/30/2022   MCV 92 07/30/2022   PLT 228 07/30/2022   Lab Results  Component Value Date   NA 128 (L) 08/28/2022   K 4.4 08/28/2022   CO2 25 08/28/2022   GLUCOSE 93 08/28/2022   BUN 15 08/28/2022   CREATININE 0.67 (L) 08/28/2022   BILITOT 0.5 07/30/2022   ALKPHOS 58 07/30/2022   AST 17 07/30/2022   ALT 18 07/30/2022   PROT 6.6 07/30/2022   ALBUMIN 4.8 07/30/2022   CALCIUM 9.1 08/28/2022   ANIONGAP 12 12/28/2021   EGFR 110 08/28/2022   Lab Results  Component Value Date   CHOL 224 (H) 07/30/2022   Lab Results  Component Value Date   HDL 81 07/30/2022   Lab Results  Component Value Date   LDLCALC 128 (H) 07/30/2022   Lab Results  Component Value Date   TRIG 85 07/30/2022   Lab Results  Component Value Date   CHOLHDL 2.8 07/30/2022   Lab Results  Component Value Date   HGBA1C 5.4 07/30/2022      Assessment & Plan:  Primary hypertension Assessment & Plan: Controlled He takes olmesartan 20 mg daily Denies headaches, dizziness, blurred vision, chest pain, palpitation and shortness of breath He reports adherence to lifestyle modification of low-sodium diet and increase physical activity Will assess BMP today Encouraged to continue treatment regimen We will follow-up in 3 months BP Readings from Last 3 Encounters:  12/03/22 128/74  10/15/22 (!) 156/88  08/28/22 128/82     Orders: -     Olmesartan Medoxomil; Take 1 tablet (20 mg total) by mouth daily.   Dispense: 90 tablet; Refill: 1  Generalized anxiety disorder Assessment & Plan: GAD-7 is 0 PHQ-9 is 0 Denies suicidal thoughts and ideation    IFG (impaired  fasting glucose) -     Hemoglobin A1c  Vitamin D deficiency -     VITAMIN D 25 Hydroxy (Vit-D Deficiency, Fractures)  Other specified hypothyroidism -     TSH + free T4  Other hyperlipidemia -     Lipid panel -     CMP14+EGFR -     CBC with Differential/Platelet    Follow-up: Return in about 4 months (around 04/05/2023).   Gilmore Laroche, FNP

## 2022-12-11 ENCOUNTER — Encounter: Payer: Self-pay | Admitting: Physician Assistant

## 2022-12-11 ENCOUNTER — Ambulatory Visit: Payer: BC Managed Care – PPO | Admitting: Physician Assistant

## 2022-12-11 ENCOUNTER — Other Ambulatory Visit: Payer: Self-pay | Admitting: Physician Assistant

## 2022-12-11 DIAGNOSIS — Z79899 Other long term (current) drug therapy: Secondary | ICD-10-CM

## 2022-12-11 DIAGNOSIS — F319 Bipolar disorder, unspecified: Secondary | ICD-10-CM | POA: Diagnosis not present

## 2022-12-11 DIAGNOSIS — F411 Generalized anxiety disorder: Secondary | ICD-10-CM | POA: Diagnosis not present

## 2022-12-11 DIAGNOSIS — G4700 Insomnia, unspecified: Secondary | ICD-10-CM | POA: Diagnosis not present

## 2022-12-11 MED ORDER — TRAZODONE HCL 100 MG PO TABS: 200.0000 mg | ORAL_TABLET | Freq: Every evening | ORAL | 1 refills | Status: DC | PRN

## 2022-12-11 NOTE — Progress Notes (Signed)
[facility]  Patient ID: Trevor Yoder,  MRN: [id number]  PCP: Gilmore Laroche, FNP  Date of Evaluation: 12/11/2022 Time spent:20 minutes  Chief Complaint:  Chief Complaint   Follow-up    HISTORY/CURRENT STATUS: HPI  For routine med check.   Doing well.  Patient is able to enjoy things.  Energy and motivation are good. Work is going well.  No extreme sadness, tearfulness, or feelings of hopelessness.  Sleeps well, takes Trazodone routinely. ADLs and personal hygiene are normal.   Denies any changes in concentration, making decisions, or remembering things.  Appetite has not changed.  Weight is stable.  Not having anxiety at least most of the time.  Denies suicidal or homicidal thoughts.  Patient denies increased energy with decreased need for sleep, increased talkativeness, racing thoughts, impulsivity or risky behaviors, increased spending, increased libido, grandiosity, increased irritability or anger, paranoia, or hallucinations.  Denies dizziness, syncope, seizures, numbness, tingling, tremor, tics, unsteady gait, slurred speech, confusion. Denies muscle or joint pain, stiffness, or dystonia.  Individual Medical History/ Review of Systems: Changes? :No      Past medications for mental health diagnoses include: Equetro, trazodone, Sonata didn't help, Melatonin, Luvox caused mania  Allergies: Luvox [fluvoxamine]  Current Medications:  Current Outpatient Medications:    aspirin EC 325 MG tablet, Take 650 mg by mouth every 6 (six) hours as needed for moderate pain., Disp: , Rfl:    b complex vitamins capsule, Take 1 capsule by mouth daily., Disp: , Rfl:    carbamazepine (TEGRETOL) 200 MG tablet, TAKE 1 TABLET BY MOUTH EVERY MORNING AND 2 TABLET BY MOUTH EVERY EVENING, Disp: 270 tablet, Rfl: 0   Cholecalciferol (VITAMIN D) 50 MCG (2000 UT) tablet, Take 2,000 Units by mouth daily., Disp: , Rfl:    olmesartan (BENICAR) 20 MG tablet, Take 1 tablet (20 mg total) by  mouth daily., Disp: 90 tablet, Rfl: 1   Omega-3 Fatty Acids (FISH OIL PO), Take 1 capsule by mouth daily., Disp: , Rfl:    traZODone (DESYREL) 100 MG tablet, Take 2 tablets (200 mg total) by mouth at bedtime as needed., Disp: 180 tablet, Rfl: 1 Medication Side Effects: none  Family Medical/ Social History: Changes? See HPI   MENTAL HEALTH EXAM:  There were no vitals taken for this visit.There is no height or weight on file to calculate BMI.  General Appearance: Casual, Neat and Well Groomed  Eye Contact:  Good  Speech:  Clear and Coherent and Normal Rate  Volume:  Normal  Mood:  Euthymic  Affect:  Congruent  Thought Process:  Goal Directed and Descriptions of Associations: Circumstantial  Orientation:  Full (Time, Place, and Person)  Thought Content: Logical   Suicidal Thoughts:  No  Homicidal Thoughts:  No  Memory:  WNL  Judgement:  Good  Insight:  Good  Psychomotor Activity:  Normal  Concentration:  Concentration: Good and Attention Span: Fair  Recall:  Good  Fund of Knowledge: Good  Language: Good  Assets:  Desire for Improvement Financial Resources/Insurance Housing Resilience Transportation  ADL's:  Intact  Cognition: WNL  Prognosis:  Good   DIAGNOSES:    ICD-10-CM   1. Bipolar I disorder (HCC)  F31.9 Carbamazepine level, total    2. Generalized anxiety disorder  F41.1     3. Insomnia, unspecified type  G47.00     4. Encounter for long-term (current) use of medications  Z79.899 Carbamazepine level, total     Receiving Psychotherapy: Yes   Sales promotion account executive  RECOMMENDATIONS:  PDMP was reviewed.  Ativan filled 07/10/2021. I provided 20 minutes of face to face time during this encounter, including time spent before and after the visit in records review, medical decision making, counseling pertinent to today's visit, and charting.   Doing well so no change needed.   Continue Tegretol 200 mg, 1 po q am, 2 qhs.  Continue trazodone 100 mg, 2 p.o. nightly as needed  sleep. Continue multivitamin, vitamin D, B complex (stressed importance) and fish oil. CBZ level. His PCP ordered other routine labs, he'll get them all drawn at the same time.  Continue therapy w/ Davy Pique Return in 6 months.  Melony Overly, PA-C

## 2022-12-13 ENCOUNTER — Other Ambulatory Visit: Payer: Self-pay

## 2022-12-13 DIAGNOSIS — Z79899 Other long term (current) drug therapy: Secondary | ICD-10-CM

## 2022-12-13 DIAGNOSIS — F3170 Bipolar disorder, currently in remission, most recent episode unspecified: Secondary | ICD-10-CM

## 2022-12-14 LAB — CBC WITH DIFFERENTIAL/PLATELET
Basophils Absolute: 0 10*3/uL (ref 0.0–0.2)
Basos: 1 %
EOS (ABSOLUTE): 0.1 10*3/uL (ref 0.0–0.4)
Eos: 2 %
Hematocrit: 40.4 % (ref 37.5–51.0)
Hemoglobin: 14.1 g/dL (ref 13.0–17.7)
Immature Grans (Abs): 0 10*3/uL (ref 0.0–0.1)
Immature Granulocytes: 0 %
Lymphocytes Absolute: 1.1 10*3/uL (ref 0.7–3.1)
Lymphs: 30 %
MCH: 33.1 pg — ABNORMAL HIGH (ref 26.6–33.0)
MCHC: 34.9 g/dL (ref 31.5–35.7)
MCV: 95 fL (ref 79–97)
Monocytes Absolute: 0.6 10*3/uL (ref 0.1–0.9)
Monocytes: 15 %
Neutrophils Absolute: 2 10*3/uL (ref 1.4–7.0)
Neutrophils: 52 %
Platelets: 226 10*3/uL (ref 150–450)
RBC: 4.26 x10E6/uL (ref 4.14–5.80)
RDW: 12.5 % (ref 11.6–15.4)
WBC: 3.8 10*3/uL (ref 3.4–10.8)

## 2022-12-14 LAB — CMP14+EGFR
ALT: 21 IU/L (ref 0–44)
AST: 21 IU/L (ref 0–40)
Albumin: 4.4 g/dL (ref 3.8–4.9)
Alkaline Phosphatase: 53 IU/L (ref 44–121)
BUN/Creatinine Ratio: 21 — ABNORMAL HIGH (ref 9–20)
BUN: 16 mg/dL (ref 6–24)
Bilirubin Total: 0.4 mg/dL (ref 0.0–1.2)
CO2: 24 mmol/L (ref 20–29)
Calcium: 9 mg/dL (ref 8.7–10.2)
Chloride: 98 mmol/L (ref 96–106)
Creatinine, Ser: 0.78 mg/dL (ref 0.76–1.27)
Globulin, Total: 1.8 g/dL (ref 1.5–4.5)
Glucose: 99 mg/dL (ref 70–99)
Potassium: 4.2 mmol/L (ref 3.5–5.2)
Sodium: 136 mmol/L (ref 134–144)
Total Protein: 6.2 g/dL (ref 6.0–8.5)
eGFR: 105 mL/min/{1.73_m2} (ref >59)

## 2022-12-14 LAB — LIPID PANEL
Chol/HDL Ratio: 2.6 ratio (ref 0.0–5.0)
Cholesterol, Total: 213 mg/dL — ABNORMAL HIGH (ref 100–199)
HDL: 82 mg/dL (ref >39)
LDL Chol Calc (NIH): 119 mg/dL — ABNORMAL HIGH (ref 0–99)
Triglycerides: 70 mg/dL (ref 0–149)
VLDL Cholesterol Cal: 12 mg/dL (ref 5–40)

## 2022-12-14 LAB — TSH+FREE T4
Free T4: 0.95 ng/dL (ref 0.82–1.77)
TSH: 1.5 u[IU]/mL (ref 0.450–4.500)

## 2022-12-14 LAB — HEMOGLOBIN A1C
Est. average glucose Bld gHb Est-mCnc: 105 mg/dL
Hgb A1c MFr Bld: 5.3 % (ref 4.8–5.6)

## 2022-12-14 LAB — VITAMIN D 25 HYDROXY (VIT D DEFICIENCY, FRACTURES): Vit D, 25-Hydroxy: 46.6 ng/mL (ref 30.0–100.0)

## 2022-12-14 NOTE — Progress Notes (Signed)
Please inform the patient that his LDL cholesterol has decreased from 128 to 119. Keep up the good work.   I recommend continuing lifestyle modification with increased physical activity.    I recommend avoiding simple carbohydrates, including cakes, sweet desserts, ice cream, soda (diet or regular), sweet tea, candies, chips, cookies, store-bought juices, alcohol in excess of 1-2 drinks a day, lemonade, artificial sweeteners, donuts, coffee creamers, and sugar-free products.  I recommend avoiding greasy, fatty foods with increased physical activity.    All other labs are stable.

## 2022-12-15 LAB — CARBAMAZEPINE LEVEL, TOTAL: Carbamazepine (Tegretol), S: 7.3 ug/mL (ref 4.0–12.0)

## 2022-12-16 NOTE — Progress Notes (Signed)
Please inform that his tegretol level is stable

## 2022-12-16 NOTE — Progress Notes (Signed)
Hello Trevor Yoder I just wanted to share Trevor Yoder tegretol levels with you.

## 2022-12-16 NOTE — Progress Notes (Signed)
Noted. Ty!

## 2023-02-19 ENCOUNTER — Other Ambulatory Visit: Payer: Self-pay | Admitting: Adult Health

## 2023-04-07 ENCOUNTER — Ambulatory Visit: Payer: BC Managed Care – PPO | Admitting: Family Medicine

## 2023-04-07 ENCOUNTER — Encounter: Payer: Self-pay | Admitting: Family Medicine

## 2023-04-07 VITALS — BP 138/78 | HR 84 | Ht 71.0 in | Wt 172.1 lb

## 2023-04-07 DIAGNOSIS — R7301 Impaired fasting glucose: Secondary | ICD-10-CM

## 2023-04-07 DIAGNOSIS — E559 Vitamin D deficiency, unspecified: Secondary | ICD-10-CM

## 2023-04-07 DIAGNOSIS — M62838 Other muscle spasm: Secondary | ICD-10-CM

## 2023-04-07 DIAGNOSIS — E7849 Other hyperlipidemia: Secondary | ICD-10-CM

## 2023-04-07 DIAGNOSIS — I10 Essential (primary) hypertension: Secondary | ICD-10-CM

## 2023-04-07 DIAGNOSIS — E038 Other specified hypothyroidism: Secondary | ICD-10-CM

## 2023-04-07 MED ORDER — BACLOFEN 10 MG PO TABS: 10.0000 mg | ORAL_TABLET | Freq: Every evening | ORAL | 0 refills | Status: DC | PRN

## 2023-04-07 NOTE — Patient Instructions (Addendum)
I appreciate the opportunity to provide care to you today!    Follow up:  4 months  Labs: please stop by the lab during the week to get your blood drawn (CBC, CMP, TSH, Lipid profile, HgA1c, Vit D)  -start taking baclofen 10 mg as needed at bedtime  Here are nonpharmacological interventions for muscle spasms in the neck and shoulders: -Heat Therapy: Apply a warm compress or heating pad to the affected area for 15-20 minutes at a time to relax tight muscles and increase blood flow. -Cold Therapy: Use an ice pack wrapped in a cloth for 15-20 minutes to reduce inflammation and numb the area if the muscle spasm is acute or accompanied by swelling. -Gentle Stretching: Slowly stretch the neck and shoulder muscles by tilting the head from side to side, forward and backward, and rotating it gently. Shoulder shrugs and arm circles can also help relieve tension. -Massage: Gently massage the affected area using circular motions to help release tension and improve blood circulation. -Hydration: Ensure adequate water intake, as dehydration can contribute to muscle cramps and spasms. -Mindful Breathing and Relaxation: Practice deep breathing exercises to promote relaxation and reduce muscle tension. Techniques such as progressive muscle relaxation can also be beneficial. -Good Posture: Maintain proper posture while sitting and standing to avoid straining the neck and shoulders. Use ergonomic chairs and desks if possible. -Corrective Exercises: Perform shoulder rolls, chin tucks, and gentle side stretches to prevent stiffness and promote mobility. -Physical Activity: Engage in light aerobic activities such as walking or swimming to keep muscles flexible and reduce stiffness. -Warm Bath or Shower: A warm bath or shower can help relax muscles and reduce spasms. -Stress Management: Practice stress reduction techniques such as yoga, meditation, or tai chi, as stress can contribute to muscle tension. -Proper Sleep  Support: Use a supportive pillow and maintain a comfortable sleeping position to prevent strain on the neck and shoulders.  Referrals today- Physical therapy   Attached with your AVS, you will find valuable resources for self-education. I highly recommend dedicating some time to thoroughly examine them.   Please continue to a heart-healthy diet and increase your physical activities. Try to exercise for at least five days a week.    It was a pleasure to see you and I look forward to continuing to work together on your health and well-being. Please do not hesitate to call the office if you need care or have questions about your care.  In case of emergency, please visit the Emergency Department for urgent care, or contact our clinic at [phone] to schedule an appointment. We're here to help you!   Have a wonderful day and week. With Gratitude, Gilmore Laroche MSN, FNP-BC

## 2023-04-07 NOTE — Progress Notes (Unsigned)
Established Patient Office Visit  Subjective:  Patient ID: Trevor Yoder, male    DOB: 1965/10/28  Age: 57 y.o. MRN: [id number]  CC:  Chief Complaint  Patient presents with   Care Management    Follow up appt    HPI Trevor Yoder is a 57 y.o. male with past medical history of hypertension, anxiety presents for f/u of  chronic medical conditions. For the details of today's visit, please refer to the assessment and plan.     Lab Results  Component Value Date   CHOL 228 (H) 04/08/2023   HDL 102 04/08/2023   LDLCALC 117 (H) 04/08/2023   TRIG 54 04/08/2023   CHOLHDL 2.2 04/08/2023    The ASCVD Risk score (Arnett DK, et al., 2019) failed to calculate for the following reasons:   The valid HDL cholesterol range is 20 to 100 mg/dL  Past Medical History:  Diagnosis Date   Bipolar disorder (HCC)    Mood disorder [facility])     Past Surgical History:  Procedure Laterality Date   CERVICAL DISCECTOMY  2011   COLONOSCOPY WITH PROPOFOL N/A 03/13/2022   Procedure: COLONOSCOPY WITH PROPOFOL;  Surgeon: Lanelle Bal, DO;  [facility]: [facility];  Service: Endoscopy;  Laterality: N/A;  7:30am, asa 2   POLYPECTOMY  03/13/2022   Procedure: POLYPECTOMY INTESTINAL;  Surgeon: Lanelle Bal, DO;  Location: [facility];  Service: Endoscopy;;    Family History  Problem Relation Age of Onset   Diabetes Mother    Hyperlipidemia Mother    Hypertension Mother    Heart disease Maternal Grandmother    Cancer Paternal Grandfather    Breast cancer Neg Hx     Social History   Socioeconomic History   Marital status: Married    Spouse name: Not on file   Number of children: Not on file   Years of education: 16   Highest education level: Not on file  Occupational History   Occupation: Programmer  Tobacco Use   Smoking status: Former    Current packs/day: 0.00    Average packs/day: 0.5 packs/day for 26.0 years (13.0 ttl pk-yrs)    Types: Cigarettes    Start date: 04/28/1983     Quit date: 04/27/2009    Years since quitting: 13.9   Smokeless tobacco: Never  Vaping Use   Vaping status: Never Used  Substance and Sexual Activity   Alcohol use: Not Currently    Alcohol/week: 14.0 standard drinks of alcohol    Types: 14 Cans of beer per week   Drug use: No   Sexual activity: Yes    Partners: Female  Other Topics Concern   Not on file  Social History Narrative   Not on file   Social Determinants of Health   Financial Resource Strain: Not on file  Food Insecurity: Not on file  Transportation Needs: Not on file  Physical Activity: Not on file  Stress: Not on file  Social Connections: Not on file  Intimate Partner Violence: Not on file    Outpatient Medications Prior to Visit  Medication Sig Dispense Refill   aspirin EC 325 MG tablet Take 650 mg by mouth every 6 (six) hours as needed for moderate pain.     b complex vitamins capsule Take 1 capsule by mouth daily.     carbamazepine (TEGRETOL) 200 MG tablet TAKE 1 TABLET BY MOUTH EVERY MORNING AND 2 TABLETS EVERY EVENING 270 tablet 0   Cholecalciferol (VITAMIN D) 50 MCG (  2000 UT) tablet Take 2,000 Units by mouth daily.     olmesartan (BENICAR) 20 MG tablet Take 1 tablet (20 mg total) by mouth daily. 90 tablet 1   Omega-3 Fatty Acids (FISH OIL PO) Take 1 capsule by mouth daily.     traZODone (DESYREL) 100 MG tablet Take 2 tablets (200 mg total) by mouth at bedtime as needed. 180 tablet 1   No facility-administered medications prior to visit.    Allergies  Allergen Reactions   Luvox [Fluvoxamine] Other (See Comments)    Manic behavior    ROS Review of Systems  Constitutional:  Negative for fatigue and fever.  Eyes:  Negative for visual disturbance.  Respiratory:  Negative for chest tightness and shortness of breath.   Cardiovascular:  Negative for chest pain and palpitations.  Musculoskeletal:        Complains of shoulder and neck tightness  Neurological:  Negative for dizziness and headaches.       Objective:    Physical Exam HENT:     Head: Normocephalic.     Right Ear: External ear normal.     Left Ear: External ear normal.     Nose: No congestion or rhinorrhea.     Mouth/Throat:     Mouth: Mucous membranes are moist.  Cardiovascular:     Rate and Rhythm: Regular rhythm.     Heart sounds: No murmur heard. Pulmonary:     Effort: No respiratory distress.     Breath sounds: Normal breath sounds.  Neurological:     Mental Status: He is alert.     BP 138/78 (BP Location: Left Arm)   Pulse 84   Ht 5\' 11"  (1.803 m)   Wt 172 lb 1.9 oz (78.1 kg)   SpO2 97%   BMI 24.01 kg/m  Wt Readings from Last 3 Encounters:  04/07/23 172 lb 1.9 oz (78.1 kg)  12/03/22 169 lb 1.3 oz (76.7 kg)  10/15/22 178 lb 12.8 oz (81.1 kg)    Lab Results  Component Value Date   TSH 1.730 04/08/2023   Lab Results  Component Value Date   WBC 3.4 04/08/2023   HGB 14.8 04/08/2023   HCT 43.2 04/08/2023   MCV 98 (H) 04/08/2023   PLT 232 04/08/2023   Lab Results  Component Value Date   NA 131 (L) 04/08/2023   K 4.5 04/08/2023   CO2 25 04/08/2023   GLUCOSE 93 04/08/2023   BUN 14 04/08/2023   CREATININE 0.74 (L) 04/08/2023   BILITOT 0.5 04/08/2023   ALKPHOS 54 04/08/2023   AST 19 04/08/2023   ALT 20 04/08/2023   PROT 6.6 04/08/2023   ALBUMIN 4.7 04/08/2023   CALCIUM 9.2 04/08/2023   ANIONGAP 12 12/28/2021   EGFR 106 04/08/2023   Lab Results  Component Value Date   CHOL 228 (H) 04/08/2023   Lab Results  Component Value Date   HDL 102 04/08/2023   Lab Results  Component Value Date   LDLCALC 117 (H) 04/08/2023   Lab Results  Component Value Date   TRIG 54 04/08/2023   Lab Results  Component Value Date   CHOLHDL 2.2 04/08/2023   Lab Results  Component Value Date   HGBA1C 5.4 04/08/2023      Assessment & Plan:  Primary hypertension Assessment & Plan: Controlled He takes olmesartan 20 mg daily Denies headaches, dizziness, blurred vision, chest pain,  palpitation and shortness of breath He reports adherence to lifestyle modification of low-sodium diet and increase physical activity  BP Readings from Last 3 Encounters:  04/07/23 138/78  12/03/22 128/74  10/15/22 (!) 156/88      Muscle spasms of neck Assessment & Plan: The patient complains of increased tightness in his shoulder and neck. He reports that he has been working at home building a deck and experiences increased achiness in his shoulder and neck by the end of the day. He has been performing supportive care with minimal relief and is requesting a referral to physical therapy.  No red flag symptoms are reported. He is encouraged to continue supportive care, and treatment with baclofen 10 mg at bedtime has been prescribed for today.   Orders: -     Baclofen; Take 1 tablet (10 mg total) by mouth at bedtime as needed for muscle spasms.  Dispense: 30 each; Refill: 0 -     Ambulatory referral to Physical Therapy  IFG (impaired fasting glucose) -     Hemoglobin A1c  Vitamin D deficiency -     VITAMIN D 25 Hydroxy (Vit-D Deficiency, Fractures)  TSH (thyroid-stimulating hormone deficiency) -     TSH + free T4  Other hyperlipidemia -     Lipid panel -     CMP14+EGFR -     CBC with Differential/Platelet  Note: This chart has been completed using Engineer, civil (consulting) software, and while attempts have been made to ensure accuracy, certain words and phrases may not be transcribed as intended.    Follow-up: No follow-ups on file.   Gilmore Laroche, FNP

## 2023-04-09 LAB — CBC WITH DIFFERENTIAL/PLATELET
Basophils Absolute: 0.1 10*3/uL (ref 0.0–0.2)
Basos: 2 %
EOS (ABSOLUTE): 0.1 10*3/uL (ref 0.0–0.4)
Eos: 3 %
Hematocrit: 43.2 % (ref 37.5–51.0)
Hemoglobin: 14.8 g/dL (ref 13.0–17.7)
Immature Grans (Abs): 0 10*3/uL (ref 0.0–0.1)
Immature Granulocytes: 0 %
Lymphocytes Absolute: 1.3 10*3/uL (ref 0.7–3.1)
Lymphs: 39 %
MCH: 33.7 pg — ABNORMAL HIGH (ref 26.6–33.0)
MCHC: 34.3 g/dL (ref 31.5–35.7)
MCV: 98 fL — ABNORMAL HIGH (ref 79–97)
Monocytes Absolute: 0.5 10*3/uL (ref 0.1–0.9)
Monocytes: 14 %
Neutrophils Absolute: 1.4 10*3/uL (ref 1.4–7.0)
Neutrophils: 42 %
Platelets: 232 10*3/uL (ref 150–450)
RBC: 4.39 x10E6/uL (ref 4.14–5.80)
RDW: 11.9 % (ref 11.6–15.4)
WBC: 3.4 10*3/uL (ref 3.4–10.8)

## 2023-04-09 LAB — HEMOGLOBIN A1C
Est. average glucose Bld gHb Est-mCnc: 108 mg/dL
Hgb A1c MFr Bld: 5.4 % (ref 4.8–5.6)

## 2023-04-09 LAB — LIPID PANEL
Chol/HDL Ratio: 2.2 ratio (ref 0.0–5.0)
Cholesterol, Total: 228 mg/dL — ABNORMAL HIGH (ref 100–199)
HDL: 102 mg/dL (ref >39)
LDL Chol Calc (NIH): 117 mg/dL — ABNORMAL HIGH (ref 0–99)
Triglycerides: 54 mg/dL (ref 0–149)
VLDL Cholesterol Cal: 9 mg/dL (ref 5–40)

## 2023-04-09 LAB — CMP14+EGFR
ALT: 20 [IU]/L (ref 0–44)
AST: 19 [IU]/L (ref 0–40)
Albumin: 4.7 g/dL (ref 3.8–4.9)
Alkaline Phosphatase: 54 [IU]/L (ref 44–121)
BUN/Creatinine Ratio: 19 (ref 9–20)
BUN: 14 mg/dL (ref 6–24)
Bilirubin Total: 0.5 mg/dL (ref 0.0–1.2)
CO2: 25 mmol/L (ref 20–29)
Calcium: 9.2 mg/dL (ref 8.7–10.2)
Chloride: 94 mmol/L — ABNORMAL LOW (ref 96–106)
Creatinine, Ser: 0.74 mg/dL — ABNORMAL LOW (ref 0.76–1.27)
Globulin, Total: 1.9 g/dL (ref 1.5–4.5)
Glucose: 93 mg/dL (ref 70–99)
Potassium: 4.5 mmol/L (ref 3.5–5.2)
Sodium: 131 mmol/L — ABNORMAL LOW (ref 134–144)
Total Protein: 6.6 g/dL (ref 6.0–8.5)
eGFR: 106 mL/min/{1.73_m2} (ref >59)

## 2023-04-09 LAB — VITAMIN D 25 HYDROXY (VIT D DEFICIENCY, FRACTURES): Vit D, 25-Hydroxy: 42.8 ng/mL (ref 30.0–100.0)

## 2023-04-09 LAB — TSH+FREE T4
Free T4: 1.05 ng/dL (ref 0.82–1.77)
TSH: 1.73 u[IU]/mL (ref 0.450–4.500)

## 2023-04-10 DIAGNOSIS — M62838 Other muscle spasm: Secondary | ICD-10-CM | POA: Insufficient documentation

## 2023-04-10 NOTE — Assessment & Plan Note (Signed)
The patient complains of increased tightness in his shoulder and neck. He reports that he has been working at home building a deck and experiences increased achiness in his shoulder and neck by the end of the day. He has been performing supportive care with minimal relief and is requesting a referral to physical therapy.  No red flag symptoms are reported. He is encouraged to continue supportive care, and treatment with baclofen 10 mg at bedtime has been prescribed for today.

## 2023-04-10 NOTE — Assessment & Plan Note (Signed)
Controlled He takes olmesartan 20 mg daily Denies headaches, dizziness, blurred vision, chest pain, palpitation and shortness of breath He reports adherence to lifestyle modification of low-sodium diet and increase physical activity BP Readings from Last 3 Encounters:  04/07/23 138/78  12/03/22 128/74  10/15/22 (!) 156/88

## 2023-04-14 NOTE — Therapy (Signed)
OUTPATIENT PHYSICAL THERAPY CERVICAL EVALUATION   Patient Name: ARSHDEEP FRUCHEY MRN: [id number] DOB:07/31/1965, 57 y.o., male Today's Date: 04/15/2023  END OF SESSION:  PT End of Session - 04/15/23 0920     Visit Number 1    Number of Visits 12    Date for PT Re-Evaluation 05/27/23    Authorization Type Blue [employer]    Progress Note Due on Visit 10    PT Start Time [phone]    PT Stop Time 1000    PT Time Calculation (min) 38 min    Activity Tolerance Patient tolerated treatment well    Behavior During Therapy [facility] for tasks assessed/performed             Past Medical History:  Diagnosis Date   Bipolar disorder (HCC)    Mood disorder (HCC)    Past Surgical History:  Procedure Laterality Date   CERVICAL DISCECTOMY  2011   COLONOSCOPY WITH PROPOFOL N/A 03/13/2022   Procedure: COLONOSCOPY WITH PROPOFOL;  Surgeon: Lanelle Bal, DO;  [facility];  Service: Endoscopy;  Laterality: N/A;  7:30am, asa 2   POLYPECTOMY  03/13/2022   Procedure: POLYPECTOMY INTESTINAL;  Surgeon: Lanelle Bal, DO;  [facility]: [facility];  Service: Endoscopy;;   Patient Active Problem List   Diagnosis Date Noted   Muscle spasms of neck 04/10/2023   Acute otitis media, right 10/15/2022   Hypertension 02/05/2022   Right elbow tendinitis 02/05/2022   Alcohol use disorder, severe, dependence (HCC) 12/28/2021   Alcohol use disorder 12/28/2021   Major depressive disorder, single episode with psychotic features (HCC) 12/05/2021   Generalized anxiety disorder 07/30/2020   Insomnia 07/30/2020   Mood disorder (HCC) 07/20/2018   Hallux limitus 11/20/2016   Elevated triglycerides with high cholesterol 05/14/2015    PCP: Gilmore Laroche, FNP  REFERRING PROVIDER: Gilmore Laroche, FNP  REFERRING DIAG: [phone] (ICD-10-CM) - Muscle spasms of neck  THERAPY DIAG:  Neck pain  Neck stiffness  Rationale for Evaluation and Treatment: Rehabilitation  ONSET DATE:  2011  SUBJECTIVE:                                                                                                                                                                                                         SUBJECTIVE STATEMENT: Patient reports he had a cervical discectomy in 2011 around the C5, C6, and C7 area. It usually is aggravated after he does a lot of physical labor. He reports pulling sensation in his hands that disturbs his sleep and it goes away  after he gets up. He feels tightness across shoulders and in between shoulder blades. He is currently building a deck by himself that is 10' in the air with lots of climbing ladders and lifting heavy posts.  Hand dominance: Right  PERTINENT HISTORY:  Per MD note on 04/07/23, "The patient complains of increased tightness in his shoulder and neck. He reports that he has been working at home building a deck and experiences increased achiness in his shoulder and neck by the end of the day."  PAIN:  Are you having pain? No - only at night when he is sleeping (9/10 in neck and hands)  PRECAUTIONS: None  RED FLAGS: None     WEIGHT BEARING RESTRICTIONS: No  FALLS:  Has patient fallen in last 6 months? No  LIVING ENVIRONMENT: Lives with: lives with their spouse Lives in: House/apartment  OCCUPATION: in between jobs currently   PLOF: Independent  PATIENT GOALS: make the pain go away and relieve back   NEXT MD VISIT: 4 months   OBJECTIVE:  Note: Objective measures were completed at Evaluation unless otherwise noted.  DIAGNOSTIC FINDINGS:  N/A   PATIENT SURVEYS:  NDI 26%  COGNITION: Overall cognitive status: Within functional limits for tasks assessed  SENSATION: WFL  POSTURE: rounded shoulders and forward head  PALPATION: Trigger points noted to B UT with muscle tightness notable in levator   CERVICAL ROM:   Active ROM A/PROM (deg) eval  Flexion WFL  Extension WFL  Right lateral flexion WFL*  Left  lateral flexion WFL*  Right rotation WFL  Left rotation WFL   (Blank rows = not tested)  UPPER EXTREMITY ROM:  B UE ROM WFL   UPPER EXTREMITY MMT:  MMT Right eval Left eval  Shoulder flexion 5 5  Shoulder extension    Shoulder abduction 5 5  Shoulder adduction 5 5  Shoulder extension    Shoulder internal rotation 5 5  Shoulder external rotation 5 5  Middle trapezius    Lower trapezius    Elbow flexion 5 5  Elbow extension 5 5   (Blank rows = not tested)  CERVICAL SPECIAL TESTS:  N/A  FUNCTIONAL TESTS:  N/A  TODAY'S TREATMENT:                                                                                                                              DATE: 04/15/23   PATIENT EDUCATION:  Education details: HEP, POC, goals  Person educated: Patient Education method: Explanation, Demonstration, and Handouts Education comprehension: verbalized understanding and returned demonstration  HOME EXERCISE PROGRAM: Access Code: [email or web address] URL: [email or web address] Date: 04/15/2023 Prepared by: Maylon Peppers  Exercises - Seated Upper Trapezius Stretch  - 2-3 x daily - 5-7 x weekly - 3-5 reps - 10-30 second hold - Seated Levator Scapulae Stretch  - 2-3 x daily - 5-7 x weekly - 3-5 reps - 10-30 second hold  ASSESSMENT:  CLINICAL IMPRESSION: Patient is a 57 y.o.  male who was seen today for physical therapy evaluation and treatment for neck pain and tightness. Patient demonstrates significant muscle tightness noted in B UT and levator scapulae with associated trigger points. No muscle weakness noted during session but pain is closely associated with muscle tightness previously mentioned. Performed B UT with GHJ overpressure with slight relief reported by patient. Provided patient with HEP focused on UT and levator stretch. Patient will benefit from skilled PT interventions to address listed impairments to improve pain and muscle tightness to be able to complete  physical labor activities.    OBJECTIVE IMPAIRMENTS: increased muscle spasms, impaired flexibility, impaired UE functional use, improper body mechanics, and postural dysfunction.   ACTIVITY LIMITATIONS: lifting, bending, and sleeping  PARTICIPATION LIMITATIONS: community activity and yard work  PERSONAL FACTORS: Age and Behavior pattern are also affecting patient's functional outcome.   REHAB POTENTIAL: Good  CLINICAL DECISION MAKING: Stable/uncomplicated  EVALUATION COMPLEXITY: Low   GOALS: Goals reviewed with patient? Yes  SHORT TERM GOALS: Target date: 05/05/2023  Patient will be independent in HEP to improve strength/mobility for better functional independence with ADLs and self management of muscular tightness in neck. Baseline: 11/12: HEP initiated Goal status: INITIAL  LONG TERM GOALS: Target date: 05/26/2023  Patient will reduce Neck Disability Index score to <10% to demonstrate minimal disability with ADL's including improved sleeping tolerance, sitting tolerance, etc for better mobility at home and work. Baseline: 11/12: 26% Goal status: INITIAL  2.  Patient will report improved sleeping tolerance with <2/10 pain in hands and neck.  Baseline: 11/12: see subjective  Goal status: INITIAL  3.  Patient will be able to complete 6-8 hours of physical labor with reports of <2/10 pain in neck and hands.  Baseline: 11/12: required rest breaks due to pain after physical labor Goal status: INITIAL  PLAN:  PT FREQUENCY: 1-2x/week  PT DURATION: 6 weeks  PLANNED INTERVENTIONS: 97164- PT Re-evaluation, 97110-Therapeutic exercises, 97530- Therapeutic activity, 97112- Neuromuscular re-education, 97535- Self Care, [id number]- Manual therapy, 97014- Electrical stimulation (unattended), [id number]- Electrical stimulation (manual), [id number]- Traction (mechanical), Dry Needling, Spinal manipulation, Spinal mobilization, Cryotherapy, and Moist heat  PLAN FOR NEXT SESSION: HEP review, manual UT  stretching and TP release   Viviann Spare, PT, DPT 04/15/2023, 11:51 AM

## 2023-04-15 ENCOUNTER — Encounter (HOSPITAL_COMMUNITY): Payer: Self-pay

## 2023-04-15 ENCOUNTER — Ambulatory Visit (HOSPITAL_COMMUNITY): Payer: BC Managed Care – PPO | Attending: Family Medicine

## 2023-04-15 DIAGNOSIS — M542 Cervicalgia: Secondary | ICD-10-CM | POA: Insufficient documentation

## 2023-04-15 DIAGNOSIS — M436 Torticollis: Secondary | ICD-10-CM | POA: Insufficient documentation

## 2023-04-15 DIAGNOSIS — M62838 Other muscle spasm: Secondary | ICD-10-CM | POA: Diagnosis not present

## 2023-04-18 ENCOUNTER — Ambulatory Visit (HOSPITAL_COMMUNITY): Payer: BC Managed Care – PPO

## 2023-04-18 DIAGNOSIS — M542 Cervicalgia: Secondary | ICD-10-CM | POA: Diagnosis not present

## 2023-04-18 DIAGNOSIS — M436 Torticollis: Secondary | ICD-10-CM

## 2023-04-18 NOTE — Therapy (Signed)
OUTPATIENT PHYSICAL THERAPY CERVICAL TREATMENT   Patient Name: RAAD BAJEMA MRN: [id number] DOB:12-03-1965, 57 y.o., male Today's Date: 04/18/2023  END OF SESSION:  PT End of Session - 04/18/23 0957     Visit Number 2    Number of Visits 12    Date for PT Re-Evaluation 05/27/23    Authorization Type [employer] Blue [employer]    Progress Note Due on Visit 10    PT Start Time 0935    PT Stop Time 1015    PT Time Calculation (min) 40 min    Activity Tolerance Patient tolerated treatment well    Behavior During Therapy [facility] for tasks assessed/performed            Past Medical History:  Diagnosis Date   Bipolar disorder (HCC)    Mood disorder (HCC)    Past Surgical History:  Procedure Laterality Date   CERVICAL DISCECTOMY  2011   COLONOSCOPY WITH PROPOFOL N/A 03/13/2022   Procedure: COLONOSCOPY WITH PROPOFOL;  Surgeon: Lanelle Bal, DO;  [facility];  Service: Endoscopy;  Laterality: N/A;  7:30am, asa 2   POLYPECTOMY  03/13/2022   Procedure: POLYPECTOMY INTESTINAL;  Surgeon: Lanelle Bal, DO;  Location: [facility];  Service: Endoscopy;;   Patient Active Problem List   Diagnosis Date Noted   Muscle spasms of neck 04/10/2023   Acute otitis media, right 10/15/2022   Hypertension 02/05/2022   Right elbow tendinitis 02/05/2022   Alcohol use disorder, severe, dependence (HCC) 12/28/2021   Alcohol use disorder 12/28/2021   Major depressive disorder, single episode with psychotic features (HCC) 12/05/2021   Generalized anxiety disorder 07/30/2020   Insomnia 07/30/2020   Mood disorder (HCC) 07/20/2018   Hallux limitus 11/20/2016   Elevated triglycerides with high cholesterol 05/14/2015    PCP: Gilmore Laroche, FNP  REFERRING PROVIDER: Gilmore Laroche, FNP  REFERRING DIAG: [phone] (ICD-10-CM) - Muscle spasms of neck  THERAPY DIAG:  Neck pain  Neck stiffness  Rationale for Evaluation and Treatment: Rehabilitation  ONSET DATE:  2011  SUBJECTIVE:                                                                                                                                                                                                         SUBJECTIVE STATEMENT: Patient is doing well today and denies and pain. Patient has been doing his HEP without issues. Patient states that he has been doing his HEP everyday.  EVAL: Patient reports he had a cervical discectomy in 2011 around the C5, C6, and C7  area. It usually is aggravated after he does a lot of physical labor. He reports pulling sensation in his hands that disturbs his sleep and it goes away after he gets up. He feels tightness across shoulders and in between shoulder blades. He is currently building a deck by himself that is 10' in the air with lots of climbing ladders and lifting heavy posts.  Hand dominance: Right  PERTINENT HISTORY:  Per MD note on 04/07/23, "The patient complains of increased tightness in his shoulder and neck. He reports that he has been working at home building a deck and experiences increased achiness in his shoulder and neck by the end of the day."  PAIN:  Are you having pain? No - only at night when he is sleeping (9/10 in neck and hands)  PRECAUTIONS: None  RED FLAGS: None     WEIGHT BEARING RESTRICTIONS: No  FALLS:  Has patient fallen in last 6 months? No  LIVING ENVIRONMENT: Lives with: lives with their spouse Lives in: House/apartment  OCCUPATION: in between jobs currently   PLOF: Independent  PATIENT GOALS: make the pain go away and relieve back   NEXT MD VISIT: 4 months   OBJECTIVE:  Note: Objective measures were completed at Evaluation unless otherwise noted.  DIAGNOSTIC FINDINGS:  N/A   PATIENT SURVEYS:  NDI 26%  COGNITION: Overall cognitive status: Within functional limits for tasks assessed  SENSATION: WFL  POSTURE: rounded shoulders and forward head  PALPATION: Trigger points noted to B UT  with muscle tightness notable in levator   CERVICAL ROM:   Active ROM A/PROM (deg) eval  Flexion WFL  Extension WFL  Right lateral flexion WFL*  Left lateral flexion WFL*  Right rotation WFL  Left rotation WFL   (Blank rows = not tested)  UPPER EXTREMITY ROM:  B UE ROM WFL   UPPER EXTREMITY MMT:  MMT Right eval Left eval  Shoulder flexion 5 5  Shoulder extension    Shoulder abduction 5 5  Shoulder adduction 5 5  Shoulder extension    Shoulder internal rotation 5 5  Shoulder external rotation 5 5  Middle trapezius    Lower trapezius    Elbow flexion 5 5  Elbow extension 5 5   (Blank rows = not tested)  CERVICAL SPECIAL TESTS:  N/A  FUNCTIONAL TESTS:  N/A  TODAY'S TREATMENT:                                                                                                                              DATE:  04/18/23  UBE, level 1, forward, 5' Seated upper trapezius stretch x 30" x 3 Seated lev scap stretch x 30" x 3 Supine chin tucks x 3" x 10 x 2 Supine cervical rotation on a pillow x 3" x 10 Doorway pec stretch x 60 deg abd x 30" x 3 I, T exercises, RTB x 10 x 2  PATIENT EDUCATION:  Education details: HEP,  POC, goals  Person educated: Patient Education method: Explanation, Demonstration, and Handouts Education comprehension: verbalized understanding and returned demonstration  HOME EXERCISE PROGRAM: Access Code: [email or web address] URL: [email or web address] 04/18/23 - Supine Cervical Retraction with Towel  - 1-2 x daily - 7 x weekly - 2 sets - 10 reps - 3 hold - Supine Cervical Rotation AROM on Pillow  - 1-2 x daily - 7 x weekly - 1 sets - 10 reps - 3 hold - Doorway Pec Stretch at 60 Degrees Abduction with Arm Straight  - 1-2 x daily - 7 x weekly - 3 reps - 30 hold - Shoulder Extension with Resistance  - 1-2 x daily - 7 x weekly - 2 sets - 10 reps - Standing Shoulder Horizontal Abduction with Anchored Resistance  - 1-2 x daily - 7 x weekly - 2 sets  - 10 reps  Date: 04/15/2023 Prepared by: Maylon Peppers  Exercises - Seated Upper Trapezius Stretch  - 2-3 x daily - 5-7 x weekly - 3-5 reps - 10-30 second hold - Seated Levator Scapulae Stretch  - 2-3 x daily - 5-7 x weekly - 3-5 reps - 10-30 second hold  ASSESSMENT:  CLINICAL IMPRESSION: Interventions today were geared towards cervical mobility and strengthening. Tolerated all activities without worsening of symptoms. However, patient reported of some numbness on B UE after the UBE which subsided. Performed ULTT testing on B revealed negative findings for the ulnar, radial, and median nerve. Demonstrated appropriate levels of fatigue. Provided slight amount of cueing to ensure correct execution of activity with good carry-over. To date, skilled PT is required to address the impairments and improve function.  EVAL: Patient is a 57 y.o. male who was seen today for physical therapy evaluation and treatment for neck pain and tightness. Patient demonstrates significant muscle tightness noted in B UT and levator scapulae with associated trigger points. No muscle weakness noted during session but pain is closely associated with muscle tightness previously mentioned. Performed B UT with GHJ overpressure with slight relief reported by patient. Provided patient with HEP focused on UT and levator stretch. Patient will benefit from skilled PT interventions to address listed impairments to improve pain and muscle tightness to be able to complete physical labor activities.    OBJECTIVE IMPAIRMENTS: increased muscle spasms, impaired flexibility, impaired UE functional use, improper body mechanics, and postural dysfunction.   ACTIVITY LIMITATIONS: lifting, bending, and sleeping  PARTICIPATION LIMITATIONS: community activity and yard work  PERSONAL FACTORS: Age and Behavior pattern are also affecting patient's functional outcome.   REHAB POTENTIAL: Good  CLINICAL DECISION MAKING:  Stable/uncomplicated  EVALUATION COMPLEXITY: Low   GOALS: Goals reviewed with patient? Yes  SHORT TERM GOALS: Target date: 05/05/2023  Patient will be independent in HEP to improve strength/mobility for better functional independence with ADLs and self management of muscular tightness in neck. Baseline: 11/12: HEP initiated Goal status: INITIAL  LONG TERM GOALS: Target date: 05/26/2023  Patient will reduce Neck Disability Index score to <10% to demonstrate minimal disability with ADL's including improved sleeping tolerance, sitting tolerance, etc for better mobility at home and work. Baseline: 11/12: 26% Goal status: INITIAL  2.  Patient will report improved sleeping tolerance with <2/10 pain in hands and neck.  Baseline: 11/12: see subjective  Goal status: INITIAL  3.  Patient will be able to complete 6-8 hours of physical labor with reports of <2/10 pain in neck and hands.  Baseline: 11/12: required rest breaks due to pain after physical labor Goal status: INITIAL  PLAN:  PT FREQUENCY: 1-2x/week  PT DURATION: 6 weeks  PLANNED INTERVENTIONS: 97164- PT Re-evaluation, 97110-Therapeutic exercises, 97530- Therapeutic activity, 97112- Neuromuscular re-education, 97535- Self Care, 16109- Manual therapy, 97014- Electrical stimulation (unattended), [id number]- Electrical stimulation (manual), [id number]- Traction (mechanical), Dry Needling, Spinal manipulation, Spinal mobilization, Cryotherapy, and Moist heat  PLAN FOR NEXT SESSION: Continue POC and may progress as tolerated with emphasis on cervical flexibility and stability. May add manual UT stretching and TP release   Nikesha Kwasny L. Sears [place]an, PT, DPT, OCS Board-Certified Clinical Specialist in Orthopedic PT PT Compact Privilege # ([place]): [id number] T 04/18/2023, 10:23 AM

## 2023-04-22 ENCOUNTER — Encounter (HOSPITAL_COMMUNITY): Payer: BC Managed Care – PPO

## 2023-04-24 ENCOUNTER — Encounter (HOSPITAL_COMMUNITY): Payer: BC Managed Care – PPO

## 2023-04-29 ENCOUNTER — Encounter (HOSPITAL_COMMUNITY): Payer: BC Managed Care – PPO

## 2023-05-05 ENCOUNTER — Telehealth: Payer: Self-pay | Admitting: Physician Assistant

## 2023-05-05 ENCOUNTER — Other Ambulatory Visit: Payer: Self-pay

## 2023-05-05 NOTE — Telephone Encounter (Signed)
Pended Carbamazepine to pharmacy that last rx was sent to.

## 2023-05-05 NOTE — Telephone Encounter (Signed)
Patient lvm at 11:19 stating that he has increased his Carbamazepine from three pills a day to four pills a day due to some elevated moods. He will run out and needs a new prescription for Carbamazepine 200mg  for four pills a day instead of three. Ph: [phone] Appt 1/10

## 2023-05-06 MED ORDER — CARBAMAZEPINE 200 MG PO TABS: ORAL_TABLET | ORAL | 0 refills | Status: DC

## 2023-05-06 NOTE — Telephone Encounter (Signed)
Please remind him he shouldn't change med doses without discussing with me first. Because of the increase, he needs a tegretol level to make sure the dose isn't too high. I'll send in the Rx, but will order the lab we need to mail it to him. Does he use [facility] or [facility]?  Thanks

## 2023-05-14 ENCOUNTER — Other Ambulatory Visit: Payer: Self-pay | Admitting: Physician Assistant

## 2023-05-14 DIAGNOSIS — Z79899 Other long term (current) drug therapy: Secondary | ICD-10-CM

## 2023-05-14 DIAGNOSIS — F319 Bipolar disorder, unspecified: Secondary | ICD-10-CM

## 2023-05-14 NOTE — Telephone Encounter (Signed)
PT notified. He uses [facility], typically, but does not have a preference.

## 2023-05-14 NOTE — Telephone Encounter (Signed)
I ordered the Tegretol level to be done at [facility].  Please mail him the order form.  Thanks

## 2023-05-14 NOTE — Telephone Encounter (Signed)
Sent!

## 2023-06-08 LAB — CARBAMAZEPINE LEVEL, TOTAL: Carbamazepine (Tegretol), S: 8.1 ug/mL (ref 4.0–12.0)

## 2023-06-13 ENCOUNTER — Other Ambulatory Visit: Payer: Self-pay | Admitting: Physician Assistant

## 2023-06-13 ENCOUNTER — Telehealth: Payer: 59 | Admitting: Physician Assistant

## 2023-06-13 ENCOUNTER — Encounter: Payer: Self-pay | Admitting: Physician Assistant

## 2023-06-13 DIAGNOSIS — G4700 Insomnia, unspecified: Secondary | ICD-10-CM | POA: Diagnosis not present

## 2023-06-13 DIAGNOSIS — F39 Unspecified mood [affective] disorder: Secondary | ICD-10-CM

## 2023-06-13 MED ORDER — CARBAMAZEPINE 200 MG PO TABS: ORAL_TABLET | ORAL | 1 refills | Status: DC

## 2023-06-13 NOTE — Progress Notes (Signed)
 [facility] Med Check  Patient ID: Trevor Yoder,  MRN: [id number]  PCP: Zarwolo, Gloria, FNP  Date of Evaluation: 06/13/2023 Time spent:30 minutes  Chief Complaint:  Chief Complaint   Follow-up   Virtual Visit via Telehealth  I connected with patient by a video enabled telemedicine application, with their informed consent, and verified patient privacy and that I am speaking with the correct person using two identifiers.  I am private, in my office and the patient is at home.  I discussed the limitations, risks, security and privacy concerns of performing an evaluation and management service by video and the availability of in person appointments. I also discussed with the patient that there may be a patient responsible charge related to this service. The patient expressed understanding and agreed to proceed.   I discussed the assessment and treatment plan with the patient. The patient was provided an opportunity to ask questions and all were answered. The patient agreed with the plan and demonstrated an understanding of the instructions.   The patient was advised to call back or seek an in-person evaluation if the symptoms worsen or if the condition fails to improve as anticipated.  I provided 30 minutes of non-face-to-face time during this encounter.  HISTORY/CURRENT STATUS: HPI  For routine 50-month med check.   Trevor Yoder is doing well.  He feels like the carbamazepine  is working as intended.  When he takes the carbamazepine  2 every morning he feels dizzy so he has been taking 1 in the morning, 1 around lunch and 2 at bedtime and that has been working better without causing dizziness.  He does sometimes forget to take the middle of the day dose.  Wonders if we can change the dose to only 3 pills/day.  Patient denies increased energy with decreased need for sleep, increased talkativeness, racing thoughts, impulsivity or risky behaviors, increased spending, increased libido,  grandiosity, increased irritability or anger, paranoia, or hallucinations.  Patient is able to enjoy things.  Energy and motivation are good.  Work is going well.   No extreme sadness, tearfulness, or feelings of hopelessness.  Sleeps well most of the time. ADLs and personal hygiene are normal.   Denies any changes in memory.  Appetite has not changed.  Weight is stable.  No complaints of anxiety.  Denies suicidal or homicidal thoughts.  States that attention is good without easy distractibility.  Able to focus on things and finish tasks to completion.   Denies dizziness, syncope, seizures, numbness, tingling, tremor, tics, unsteady gait, slurred speech, confusion. Denies muscle or joint pain, stiffness, or dystonia.  Individual Medical History/ Review of Systems: Changes? :No      Past medications for mental health diagnoses include: Equetro , trazodone , Sonata  didn't help, Melatonin, Luvox  caused mania  Allergies: Luvox  [fluvoxamine ]  Current Medications:  Current Outpatient Medications:    b complex vitamins capsule, Take 1 capsule by mouth daily., Disp: , Rfl:    Cholecalciferol (VITAMIN D ) 50 MCG (2000 UT) tablet, Take 2,000 Units by mouth daily., Disp: , Rfl:    olmesartan  (BENICAR ) 20 MG tablet, Take 1 tablet (20 mg total) by mouth daily., Disp: 90 tablet, Rfl: 1   Omega-3 Fatty Acids (FISH OIL PO), Take 1 capsule by mouth daily., Disp: , Rfl:    traZODone  (DESYREL ) 100 MG tablet, Take 2 tablets (200 mg total) by mouth at bedtime as needed., Disp: 180 tablet, Rfl: 1   carbamazepine  (TEGRETOL ) 200 MG tablet, 1 po q am, 2 at bedtime., Disp: 270 tablet,  Rfl: 1 Medication Side Effects: none  Family Medical/ Social History: Changes?  No  MENTAL HEALTH EXAM:  There were no vitals taken for this visit.There is no height or weight on file to calculate BMI.  General Appearance: Casual, Neat and Well Groomed  Eye Contact:  Good  Speech:  Clear and Coherent and Normal Rate  Volume:   Normal  Mood:  Euthymic  Affect:  Congruent  Thought Process:  Goal Directed and Descriptions of Associations: Circumstantial  Orientation:  Full (Time, Place, and Person)  Thought Content: Logical   Suicidal Thoughts:  No  Homicidal Thoughts:  No  Memory:  WNL  Judgement:  Good  Insight:  Good  Psychomotor Activity:  Normal  Concentration:  Concentration: Good and Attention Span: Good  Recall:  Good  Fund of Knowledge: Good  Language: Good  Assets:  Desire for Improvement Financial Resources/Insurance Housing Resilience Transportation  ADL's:  Intact  Cognition: WNL  Prognosis:  Good   Labs 06/06/2023 Carbamazepine  level 8.1  04/08/2023 CBC with differential normal except MCV 98, MCH 33.7 CMP, creatinine 0.7, sodium 131, chloride 94, otherwise normal Lipid panel total cholesterol 228, triglycerides 54, HDL 102, LDL 117 Hemoglobin A1c 5.4 Vitamin D  level 42.8 TSH 1.7  DIAGNOSES:    ICD-10-CM   1. Episodic mood disorder (HCC)  F39     2. Insomnia, unspecified type  G47.00       Receiving Psychotherapy: Yes   [name]  RECOMMENDATIONS:  PDMP was reviewed.  Ativan  filled 07/10/2021. I provided 30 minutes of non-face to face time during this encounter, including time spent before and after the visit in records review, medical decision making, counseling pertinent to today's visit, and charting.   We discussed the carbamazepine .  He is doing well on a total of 600 mg/day and his recent level was 8.1.  It is okay to continue the lower dose.  Continue Tegretol  200 mg, 1 po q am, 2 qhs.  Continue trazodone  100 mg, 2 p.o. nightly as needed sleep. Continue vitamins as per med list.   Continue therapy w/ Obie Sous Return in 6 months.  Verneita Cooks, PA-C

## 2023-06-18 ENCOUNTER — Other Ambulatory Visit: Payer: Self-pay | Admitting: Family Medicine

## 2023-06-18 DIAGNOSIS — I10 Essential (primary) hypertension: Secondary | ICD-10-CM

## 2023-08-05 ENCOUNTER — Ambulatory Visit: Payer: BC Managed Care – PPO | Admitting: Family Medicine

## 2023-09-04 ENCOUNTER — Ambulatory Visit: Payer: BC Managed Care – PPO | Admitting: Family Medicine

## 2023-09-23 ENCOUNTER — Ambulatory Visit: Admitting: Internal Medicine

## 2023-09-23 ENCOUNTER — Encounter: Payer: Self-pay | Admitting: Internal Medicine

## 2023-09-23 VITALS — BP 118/70 | HR 83 | Ht 71.0 in | Wt 179.6 lb

## 2023-09-23 DIAGNOSIS — L239 Allergic contact dermatitis, unspecified cause: Secondary | ICD-10-CM | POA: Insufficient documentation

## 2023-09-23 MED ORDER — CLOTRIMAZOLE-BETAMETHASONE 1-0.05 % EX CREA: 1.0000 | TOPICAL_CREAM | Freq: Two times a day (BID) | CUTANEOUS | 0 refills | Status: AC

## 2023-09-23 MED ORDER — TRIAMCINOLONE ACETONIDE 0.1 % EX CREA: 1.0000 | TOPICAL_CREAM | Freq: Two times a day (BID) | CUTANEOUS | 1 refills | Status: DC

## 2023-09-23 NOTE — Assessment & Plan Note (Signed)
 Differential also includes tinea infection Started Lotrisone  cream Apply lotion or moisturizer to prevent dry skin

## 2023-09-23 NOTE — Progress Notes (Signed)
 Acute Office Visit  Subjective:    Patient ID: Trevor Yoder, male    DOB: Apr 14, 1966, 58 y.o.   MRN: [id number]  Chief Complaint  Patient presents with   Blister    Pt reports sx of arm blister on both arms. Ongoing for a week. Area is itchy.     HPI Patient is in today for complaint of rash in the upper arm area, right > left and has rash on the right side chest wall area as well for the last 1 week.  He has intense itching in the area.  Reports working outdoors building his deck for the last few weeks.  Denies any new chemical exposure.  Past Medical History:  Diagnosis Date   Bipolar disorder (HCC)    Mood disorder [facility])     Past Surgical History:  Procedure Laterality Date   CERVICAL DISCECTOMY  2011   COLONOSCOPY WITH PROPOFOL  N/A 03/13/2022   Procedure: COLONOSCOPY WITH PROPOFOL ;  Surgeon: Vinetta Greening, DO;  [facility];  Service: Endoscopy;  Laterality: N/A;  7:30am, asa 2   POLYPECTOMY  03/13/2022   Procedure: POLYPECTOMY INTESTINAL;  Surgeon: Vinetta Greening, DO;  [facility];  Service: Endoscopy;;    Family History  Problem Relation Age of Onset   Diabetes Mother    Hyperlipidemia Mother    Hypertension Mother    Heart disease Maternal Grandmother    Cancer Paternal Grandfather    Breast cancer Neg Hx     Social History   Socioeconomic History   Marital status: Married    Spouse name: Not on file   Number of children: Not on file   Years of education: 16   Highest education level: Not on file  Occupational History   Occupation: Programmer  Tobacco Use   Smoking status: Former    Current packs/day: 0.00    Average packs/day: 0.5 packs/day for 26.0 years (13.0 ttl pk-yrs)    Types: Cigarettes    Start date: 04/28/1983    Quit date: 04/27/2009    Years since quitting: 14.4   Smokeless tobacco: Never  Vaping Use   Vaping status: Never Used  Substance and Sexual Activity   Alcohol use: Yes    Alcohol/week: 14.0  standard drinks of alcohol    Types: 14 Cans of beer per week   Drug use: No   Sexual activity: Yes    Partners: Female  Other Topics Concern   Not on file  Social History Narrative   Not on file   Social Drivers of Health   Financial Resource Strain: Not on file  Food Insecurity: Not on file  Transportation Needs: Not on file  Physical Activity: Not on file  Stress: Not on file  Social Connections: Not on file  Intimate Partner Violence: Not on file    Outpatient Medications Prior to Visit  Medication Sig Dispense Refill   b complex vitamins capsule Take 1 capsule by mouth daily.     carbamazepine  (TEGRETOL ) 200 MG tablet 1 po q am, 2 at bedtime. 270 tablet 1   Cholecalciferol (VITAMIN D ) 50 MCG (2000 UT) tablet Take 2,000 Units by mouth daily.     olmesartan  (BENICAR ) 20 MG tablet TAKE 1 TABLET(20 MG) BY MOUTH DAILY 90 tablet 1   Omega-3 Fatty Acids (FISH OIL PO) Take 1 capsule by mouth daily.     traZODone  (DESYREL ) 100 MG tablet TAKE 2 TABLETS(200 MG) BY MOUTH AT BEDTIME AS NEEDED 180  tablet 1   No facility-administered medications prior to visit.    Allergies  Allergen Reactions   Luvox  [Fluvoxamine ] Other (See Comments)    Manic behavior    Review of Systems  Constitutional:  Negative for chills and fever.  HENT:  Negative for congestion and sore throat.   Eyes:  Negative for pain and discharge.  Respiratory:  Negative for cough and shortness of breath.   Cardiovascular:  Negative for chest pain and palpitations.  Gastrointestinal:  Negative for diarrhea, nausea and vomiting.  Endocrine: Negative for polydipsia and polyuria.  Genitourinary:  Negative for dysuria and hematuria.  Musculoskeletal:  Negative for neck pain and neck stiffness.  Skin:  Positive for rash.  Neurological:  Negative for dizziness and weakness.  Psychiatric/Behavioral:  Negative for agitation and behavioral problems.        Objective:    Physical Exam Constitutional:      General:  He is not in acute distress.    Appearance: He is not diaphoretic.  HENT:     Mouth/Throat:     Mouth: Mucous membranes are moist.     Pharynx: No posterior oropharyngeal erythema.  Eyes:     General: No scleral icterus.    Extraocular Movements: Extraocular movements intact.  Cardiovascular:     Heart sounds: Normal heart sounds. No murmur heard. Pulmonary:     Breath sounds: Normal breath sounds. No wheezing or rales.  Skin:    General: Skin is warm.     Findings: Rash (Erythematous circular patches over right upper arm area and right side chest wall area) present.  Neurological:     General: No focal deficit present.     Mental Status: He is alert and oriented to person, place, and time.  Psychiatric:        Mood and Affect: Mood normal.        Behavior: Behavior normal.     BP 118/70   Pulse 83   Ht 5\' 11"  (1.803 m)   Wt 179 lb 9.6 oz (81.5 kg)   SpO2 95%   BMI 25.05 kg/m  Wt Readings from Last 3 Encounters:  09/23/23 179 lb 9.6 oz (81.5 kg)  04/07/23 172 lb 1.9 oz (78.1 kg)  12/03/22 169 lb 1.3 oz (76.7 kg)        Assessment & Plan:   Problem List Items Addressed This Visit       Musculoskeletal and Integument   Allergic dermatitis - Primary   Differential also includes tinea infection Started Lotrisone  cream Apply lotion or moisturizer to prevent dry skin      Relevant Medications   clotrimazole -betamethasone  (LOTRISONE ) cream     Meds ordered this encounter  Medications   DISCONTD: triamcinolone  cream (KENALOG ) 0.1 %    Sig: Apply 1 Application topically 2 (two) times daily.    Dispense:  30 g    Refill:  1   clotrimazole -betamethasone  (LOTRISONE ) cream    Sig: Apply 1 Application topically 2 (two) times daily. Please cancel Kenalog  prescription.    Dispense:  60 g    Refill:  0     Aviyanna Colbaugh Alyssa Backbone, MD

## 2023-09-23 NOTE — Patient Instructions (Addendum)
 Please apply Clotrimazole -betamethasone  cream as prescribed for rash.  Please apply lotion or moisturizer regularly.

## 2023-11-20 ENCOUNTER — Encounter: Payer: Self-pay | Admitting: Family Medicine

## 2023-11-20 ENCOUNTER — Ambulatory Visit: Admitting: Family Medicine

## 2023-11-20 VITALS — BP 130/80 | HR 66 | Resp 16 | Ht 71.0 in | Wt 180.0 lb

## 2023-11-20 DIAGNOSIS — E7849 Other hyperlipidemia: Secondary | ICD-10-CM

## 2023-11-20 DIAGNOSIS — R200 Anesthesia of skin: Secondary | ICD-10-CM | POA: Diagnosis not present

## 2023-11-20 DIAGNOSIS — R7301 Impaired fasting glucose: Secondary | ICD-10-CM | POA: Diagnosis not present

## 2023-11-20 DIAGNOSIS — E559 Vitamin D deficiency, unspecified: Secondary | ICD-10-CM

## 2023-11-20 DIAGNOSIS — I10 Essential (primary) hypertension: Secondary | ICD-10-CM

## 2023-11-20 DIAGNOSIS — E038 Other specified hypothyroidism: Secondary | ICD-10-CM

## 2023-11-20 NOTE — Progress Notes (Unsigned)
 Established Patient Office Visit  Subjective:  Patient ID: Trevor Yoder, male    DOB: 1966-01-02  Age: 58 y.o. MRN: [id number]  CC:  Chief Complaint  Patient presents with   Hypertension    Follow up visit    Numbness    Has been having some numbness in his left heel for a few weeks     HPI Trevor Yoder is a 58 y.o. male with past medical history of Hypertension, mood disorder presents for f/u of  chronic medical conditions.  For the details of today's visit, please refer to the assessment and plan.     Past Medical History:  Diagnosis Date   Bipolar disorder (HCC)    Mood disorder [facility])     Past Surgical History:  Procedure Laterality Date   CERVICAL DISCECTOMY  2011   COLONOSCOPY WITH PROPOFOL  N/A 03/13/2022   Procedure: COLONOSCOPY WITH PROPOFOL ;  Surgeon: Cindie Carlin POUR, DO;  Location: [facility];  Service: Endoscopy;  Laterality: N/A;  7:30am, asa 2   POLYPECTOMY  03/13/2022   Procedure: POLYPECTOMY INTESTINAL;  Surgeon: Cindie Carlin POUR, DO;  Location: [facility];  Service: Endoscopy;;    Family History  Problem Relation Age of Onset   Diabetes Mother    Hyperlipidemia Mother    Hypertension Mother    Heart disease Maternal Grandmother    Cancer Paternal Grandfather    Breast cancer Neg Hx     Social History   Socioeconomic History   Marital status: Married    Spouse name: Not on file   Number of children: Not on file   Years of education: 16   Highest education level: Not on file  Occupational History   Occupation: Programmer  Tobacco Use   Smoking status: Former    Current packs/day: 0.00    Average packs/day: 0.5 packs/day for 26.0 years (13.0 ttl pk-yrs)    Types: Cigarettes    Start date: 04/28/1983    Quit date: 04/27/2009    Years since quitting: 14.5   Smokeless tobacco: Never  Vaping Use   Vaping status: Never Used  Substance and Sexual Activity   Alcohol use: Yes    Alcohol/week: 14.0 standard drinks of alcohol     Types: 14 Cans of beer per week   Drug use: No   Sexual activity: Yes    Partners: Female  Other Topics Concern   Not on file  Social History Narrative   Not on file   Social Drivers of Health   Financial Resource Strain: Not on file  Food Insecurity: Not on file  Transportation Needs: Not on file  Physical Activity: Not on file  Stress: Not on file  Social Connections: Not on file  Intimate Partner Violence: Not on file    Outpatient Medications Prior to Visit  Medication Sig Dispense Refill   b complex vitamins capsule Take 1 capsule by mouth daily.     carbamazepine  (TEGRETOL ) 200 MG tablet 1 po q am, 2 at bedtime. 270 tablet 1   Cholecalciferol (VITAMIN D ) 50 MCG (2000 UT) tablet Take 2,000 Units by mouth daily.     clotrimazole -betamethasone  (LOTRISONE ) cream Apply 1 Application topically 2 (two) times daily. Please cancel Kenalog  prescription. 60 g 0   olmesartan  (BENICAR ) 20 MG tablet TAKE 1 TABLET(20 MG) BY MOUTH DAILY 90 tablet 1   Omega-3 Fatty Acids (FISH OIL PO) Take 1 capsule by mouth daily.     traZODone  (DESYREL ) 100 MG tablet TAKE  2 TABLETS(200 MG) BY MOUTH AT BEDTIME AS NEEDED 180 tablet 1   No facility-administered medications prior to visit.    Allergies  Allergen Reactions   Luvox  [Fluvoxamine ] Other (See Comments)    Manic behavior    ROS Review of Systems  Constitutional:  Negative for fatigue and fever.  Eyes:  Negative for visual disturbance.  Respiratory:  Negative for chest tightness and shortness of breath.   Cardiovascular:  Negative for chest pain and palpitations.  Neurological:  Negative for dizziness and headaches.      Objective:    Physical Exam HENT:     Head: Normocephalic.     Right Ear: External ear normal.     Left Ear: External ear normal.     Nose: No congestion or rhinorrhea.     Mouth/Throat:     Mouth: Mucous membranes are moist.   Cardiovascular:     Rate and Rhythm: Regular rhythm.     Heart sounds: No murmur  heard. Pulmonary:     Effort: No respiratory distress.     Breath sounds: Normal breath sounds.   Neurological:     Mental Status: He is alert.     BP 130/80   Pulse 66   Resp 16   Ht 5' 11 (1.803 m)   Wt 180 lb (81.6 kg)   SpO2 98%   BMI 25.10 kg/m  Wt Readings from Last 3 Encounters:  11/20/23 180 lb (81.6 kg)  09/23/23 179 lb 9.6 oz (81.5 kg)  04/07/23 172 lb 1.9 oz (78.1 kg)    Lab Results  Component Value Date   TSH 1.320 11/20/2023   Lab Results  Component Value Date   WBC 3.5 11/20/2023   HGB 14.4 11/20/2023   HCT 43.2 11/20/2023   MCV 98 (H) 11/20/2023   PLT 218 11/20/2023   Lab Results  Component Value Date   NA 127 (L) 11/20/2023   K 4.6 11/20/2023   CO2 23 11/20/2023   GLUCOSE 92 11/20/2023   BUN 11 11/20/2023   CREATININE 0.67 (L) 11/20/2023   BILITOT 0.5 11/20/2023   ALKPHOS 55 11/20/2023   AST 17 11/20/2023   ALT 17 11/20/2023   PROT 6.5 11/20/2023   ALBUMIN 4.7 11/20/2023   CALCIUM 8.8 11/20/2023   ANIONGAP 12 12/28/2021   EGFR 110 11/20/2023   Lab Results  Component Value Date   CHOL 245 (H) 11/20/2023   Lab Results  Component Value Date   HDL 68 11/20/2023   Lab Results  Component Value Date   LDLCALC 157 (H) 11/20/2023   Lab Results  Component Value Date   TRIG 113 11/20/2023   Lab Results  Component Value Date   CHOLHDL 3.6 11/20/2023   Lab Results  Component Value Date   HGBA1C 5.4 11/20/2023      Assessment & Plan:  Primary hypertension Assessment & Plan: Controlled He takes olmesartan  20 mg daily Denies headaches, dizziness, blurred vision, chest pain, palpitation and shortness of breath He reports adherence to lifestyle modification of low-sodium diet and increase physical activity BP Readings from Last 3 Encounters:  11/20/23 130/80  09/23/23 118/70  04/07/23 138/78      Numbness Assessment & Plan: Patient declines pharmacological therapy at this time. Encouraged to take OTC Vitamin B6 supplement  as needed. -I recommend avoiding prolonged pressure on the heel (e.g., sitting with leg crossed, tight shoes). -Recommend gentle stretching and range-of-motion exercises. -Use cushioned or orthopedic footwear to relieve pressure on the heel. Will  reevaluate at next appointment.    IFG (impaired fasting glucose) -     Hemoglobin A1c  Vitamin D  deficiency -     VITAMIN D  25 Hydroxy (Vit-D Deficiency, Fractures)  TSH (thyroid-stimulating hormone deficiency) -     TSH + free T4  Other hyperlipidemia -     Lipid panel -     CMP14+EGFR -     CBC with Differential/Platelet  Note: This chart has been completed using Engineer, civil (consulting) software, and while attempts have been made to ensure accuracy, certain words and phrases may not be transcribed as intended.    Follow-up: Return in about 4 months (around 03/21/2024).   Jabri Blancett, FNP

## 2023-11-20 NOTE — Patient Instructions (Signed)
 I appreciate the opportunity to provide care to you today!    Follow up:  4 months  Labs: please stop by the lab today to get your blood drawn (CBC, CMP, TSH, Lipid profile, HgA1c, Vit D)  Nonpharmacological Interventions: -I recommend avoiding prolonged pressure on the heel (e.g., sitting with leg crossed, tight shoes). -Recommend gentle stretching and range-of-motion exercises. -Use cushioned or orthopedic footwear to relieve pressure on the heel.   Please follow up if your symptoms worsen or fail to improve. .   Please continue to a heart-healthy diet and increase your physical activities. Try to exercise for at least five days a week.    It was a pleasure to see you and I look forward to continuing to work together on your health and well-being. Please do not hesitate to call the office if you need care or have questions about your care.  In case of emergency, please visit the Emergency Department for urgent care, or contact our clinic at [phone] to schedule an appointment. We're here to help you!   Have a wonderful day and week. With Gratitude, Zacherie Honeyman MSN, FNP-BC

## 2023-11-21 LAB — CBC WITH DIFFERENTIAL/PLATELET
Basophils Absolute: 0 10*3/uL (ref 0.0–0.2)
Basos: 1 %
EOS (ABSOLUTE): 0.1 10*3/uL (ref 0.0–0.4)
Eos: 3 %
Hematocrit: 43.2 % (ref 37.5–51.0)
Hemoglobin: 14.4 g/dL (ref 13.0–17.7)
Immature Grans (Abs): 0 10*3/uL (ref 0.0–0.1)
Immature Granulocytes: 0 %
Lymphocytes Absolute: 1 10*3/uL (ref 0.7–3.1)
Lymphs: 29 %
MCH: 32.8 pg (ref 26.6–33.0)
MCHC: 33.3 g/dL (ref 31.5–35.7)
MCV: 98 fL — ABNORMAL HIGH (ref 79–97)
Monocytes Absolute: 0.5 10*3/uL (ref 0.1–0.9)
Monocytes: 14 %
Neutrophils Absolute: 1.8 10*3/uL (ref 1.4–7.0)
Neutrophils: 53 %
Platelets: 218 10*3/uL (ref 150–450)
RBC: 4.39 x10E6/uL (ref 4.14–5.80)
RDW: 12.7 % (ref 11.6–15.4)
WBC: 3.5 10*3/uL (ref 3.4–10.8)

## 2023-11-21 LAB — CMP14+EGFR
ALT: 17 IU/L (ref 0–44)
AST: 17 IU/L (ref 0–40)
Albumin: 4.7 g/dL (ref 3.8–4.9)
Alkaline Phosphatase: 55 IU/L (ref 44–121)
BUN/Creatinine Ratio: 16 (ref 9–20)
BUN: 11 mg/dL (ref 6–24)
Bilirubin Total: 0.5 mg/dL (ref 0.0–1.2)
CO2: 23 mmol/L (ref 20–29)
Calcium: 8.8 mg/dL (ref 8.7–10.2)
Chloride: 91 mmol/L — ABNORMAL LOW (ref 96–106)
Creatinine, Ser: 0.67 mg/dL — ABNORMAL LOW (ref 0.76–1.27)
Globulin, Total: 1.8 g/dL (ref 1.5–4.5)
Glucose: 92 mg/dL (ref 70–99)
Potassium: 4.6 mmol/L (ref 3.5–5.2)
Sodium: 127 mmol/L — ABNORMAL LOW (ref 134–144)
Total Protein: 6.5 g/dL (ref 6.0–8.5)
eGFR: 110 mL/min/{1.73_m2} (ref >59)

## 2023-11-21 LAB — LIPID PANEL
Chol/HDL Ratio: 3.6 ratio (ref 0.0–5.0)
Cholesterol, Total: 245 mg/dL — ABNORMAL HIGH (ref 100–199)
HDL: 68 mg/dL (ref >39)
LDL Chol Calc (NIH): 157 mg/dL — ABNORMAL HIGH (ref 0–99)
Triglycerides: 113 mg/dL (ref 0–149)
VLDL Cholesterol Cal: 20 mg/dL (ref 5–40)

## 2023-11-21 LAB — TSH+FREE T4
Free T4: 1.05 ng/dL (ref 0.82–1.77)
TSH: 1.32 u[IU]/mL (ref 0.450–4.500)

## 2023-11-21 LAB — VITAMIN D 25 HYDROXY (VIT D DEFICIENCY, FRACTURES): Vit D, 25-Hydroxy: 48.3 ng/mL (ref 30.0–100.0)

## 2023-11-21 LAB — HEMOGLOBIN A1C
Est. average glucose Bld gHb Est-mCnc: 108 mg/dL
Hgb A1c MFr Bld: 5.4 % (ref 4.8–5.6)

## 2023-11-23 ENCOUNTER — Ambulatory Visit: Payer: Self-pay | Admitting: Family Medicine

## 2023-11-23 DIAGNOSIS — E7849 Other hyperlipidemia: Secondary | ICD-10-CM

## 2023-11-23 MED ORDER — ROSUVASTATIN CALCIUM 10 MG PO TABS: 10.0000 mg | ORAL_TABLET | Freq: Every day | ORAL | 3 refills | Status: AC

## 2023-11-24 DIAGNOSIS — R200 Anesthesia of skin: Secondary | ICD-10-CM | POA: Insufficient documentation

## 2023-11-24 NOTE — Assessment & Plan Note (Addendum)
 Patient declines pharmacological therapy at this time. Encouraged to take OTC Vitamin B6 supplement as needed. -I recommend avoiding prolonged pressure on the heel (e.g., sitting with leg crossed, tight shoes). -Recommend gentle stretching and range-of-motion exercises. -Use cushioned or orthopedic footwear to relieve pressure on the heel. Will reevaluate at next appointment.

## 2023-11-24 NOTE — Assessment & Plan Note (Signed)
 Controlled He takes olmesartan  20 mg daily Denies headaches, dizziness, blurred vision, chest pain, palpitation and shortness of breath He reports adherence to lifestyle modification of low-sodium diet and increase physical activity BP Readings from Last 3 Encounters:  11/20/23 130/80  09/23/23 118/70  04/07/23 138/78

## 2023-12-06 ENCOUNTER — Other Ambulatory Visit: Payer: Self-pay | Admitting: Physician Assistant

## 2023-12-11 ENCOUNTER — Telehealth: Payer: 59 | Admitting: Physician Assistant

## 2023-12-11 ENCOUNTER — Encounter: Payer: Self-pay | Admitting: Physician Assistant

## 2023-12-11 DIAGNOSIS — F39 Unspecified mood [affective] disorder: Secondary | ICD-10-CM

## 2023-12-11 DIAGNOSIS — G4700 Insomnia, unspecified: Secondary | ICD-10-CM | POA: Diagnosis not present

## 2023-12-11 MED ORDER — CARBAMAZEPINE 200 MG PO TABS: ORAL_TABLET | ORAL | 1 refills | Status: DC

## 2023-12-11 MED ORDER — HYDROXYZINE HCL 25 MG PO TABS: 12.5000 mg | ORAL_TABLET | Freq: Three times a day (TID) | ORAL | 1 refills | Status: DC | PRN

## 2023-12-11 NOTE — Progress Notes (Signed)
 [facility]  Patient ID: Trevor Yoder,  MRN: [id number]  PCP: Zarwolo, Gloria, FNP  Date of Evaluation: 06/13/2023 Time spent:35 minutes  Chief Complaint:  Chief Complaint   Depression; Insomnia; Follow-up    Virtual Visit via Telehealth  I connected with patient by a video enabled telemedicine application, with their informed consent, and verified patient privacy and that I am speaking with the correct person using two identifiers.  I am private, in my office and the patient is at home.  I discussed the limitations, risks, security and privacy concerns of performing an evaluation and management service by video and the availability of in person appointments. I also discussed with the patient that there may be a patient responsible charge related to this service. The patient expressed understanding and agreed to proceed.   I discussed the assessment and treatment plan with the patient. The patient was provided an opportunity to ask questions and all were answered. The patient agreed with the plan and demonstrated an understanding of the instructions.   The patient was advised to call back or seek an in-person evaluation if the symptoms worsen or if the condition fails to improve as anticipated.  I provided 35 minutes of non-face-to-face time during this encounter.  HISTORY/CURRENT STATUS: HPI  For routine 14-month med check.   As far as his mood goes he is stable.  No anhedonia.  Work is going well.   No reports of feeling hopeless.  ADLs and personal hygiene are normal.  No reported changes in memory.  Appetite has not changed.  Weight is stable.  No anxiety or panic attacks.  Denies suicidal or homicidal thoughts.  Is having trouble sleeping.  He falls asleep okay but sometimes wakes up around 3 AM and cannot go back to sleep.  On those nights he may get 5 to 6 hours of sleep, then others it say usual amount of sleep.  The quality of sleep is good, it is just the  waking up that is bothersome.  The trazodone  does not seem to be effective anymore.  Patient denies increased energy with decreased need for sleep, increased talkativeness, racing thoughts, impulsivity or risky behaviors, increased spending, increased libido, grandiosity, increased irritability or anger, paranoia, or hallucinations.  Denies dizziness, syncope, seizures, numbness, tingling, tremor, tics, unsteady gait, slurred speech, confusion. Denies muscle or joint pain, stiffness, or dystonia.  Individual Medical History/ Review of Systems: Changes? :No      Past medications for mental health diagnoses include: Equetro , trazodone , Sonata  didn't help, Melatonin, Luvox  caused mania  Allergies: Luvox  [fluvoxamine ]  Current Medications:  Current Outpatient Medications:    b complex vitamins capsule, Take 1 capsule by mouth daily., Disp: , Rfl:    Cholecalciferol (VITAMIN D ) 50 MCG (2000 UT) tablet, Take 2,000 Units by mouth daily., Disp: , Rfl:    clotrimazole -betamethasone  (LOTRISONE ) cream, Apply 1 Application topically 2 (two) times daily. Please cancel Kenalog  prescription., Disp: 60 g, Rfl: 0   hydrOXYzine  (ATARAX ) 25 MG tablet, Take 0.5-1 tablets (12.5-25 mg total) by mouth every 8 (eight) hours as needed., Disp: 60 tablet, Rfl: 1   olmesartan  (BENICAR ) 20 MG tablet, TAKE 1 TABLET(20 MG) BY MOUTH DAILY, Disp: 90 tablet, Rfl: 1   Omega-3 Fatty Acids (FISH OIL PO), Take 1 capsule by mouth daily., Disp: , Rfl:    rosuvastatin  (CRESTOR ) 10 MG tablet, Take 1 tablet (10 mg total) by mouth daily., Disp: 90 tablet, Rfl: 3   traZODone  (DESYREL ) 100 MG tablet, TAKE  2 TABLETS(200 MG) BY MOUTH AT BEDTIME AS NEEDED, Disp: 180 tablet, Rfl: 1   carbamazepine  (TEGRETOL ) 200 MG tablet, TAKE 1 TABLET BY MOUTH EVERY MORNING THEN TAKE 2 TABLETS BY MOUTH EVERY NIGHT AT BEDTIME, Disp: 270 tablet, Rfl: 1 Medication Side Effects: none  Family Medical/ Social History: Changes?  No  MENTAL HEALTH  EXAM:  There were no vitals taken for this visit.There is no height or weight on file to calculate BMI.  General Appearance: Casual, Neat and Well Groomed  Eye Contact:  Good  Speech:  Clear and Coherent and Normal Rate  Volume:  Normal  Mood:  Euthymic  Affect:  Congruent  Thought Process:  Goal Directed and Descriptions of Associations: Circumstantial  Orientation:  Full (Time, Place, and Person)  Thought Content: Logical   Suicidal Thoughts:  No  Homicidal Thoughts:  No  Memory:  WNL  Judgement:  Good  Insight:  Good  Psychomotor Activity:  Normal  Concentration:  Concentration: Good and Attention Span: Good  Recall:  Good  Fund of Knowledge: Good  Language: Good  Assets:  Communication Skills Desire for Improvement Financial Resources/Insurance Housing Resilience Transportation Vocational/Educational  ADL's:  Intact  Cognition: WNL  Prognosis:  Good   Labs 11/20/2023.   CBC with differential within normal limits CMP essentially normal Total cholesterol 245, LDL 157, otherwise completely normal TSH 1.320 Hemoglobin A1c 5.4 Vitamin D  48.3  Last carbamazepine  was 06/06/2023, 8.1  DIAGNOSES:    ICD-10-CM   1. Episodic mood disorder (HCC)  F39     2. Insomnia, unspecified type  G47.00       Receiving Psychotherapy: Yes   [name]  RECOMMENDATIONS:  PDMP was reviewed.  No recent controlled substances. I provided approximately 35 minutes of non-face-to-face time during this encounter, including time spent before and after the visit in records review, medical decision making, counseling pertinent to today's visit, and charting.   We discussed sleep hygiene.  I recommend starting hydroxyzine .  Benefits, risk and side effects were discussed and he would like to try it.  We also discussed mirtazapine, but the side effect of increased hunger and weight gain is not something that neither of us  want for him so we will not start that.  If the hydroxyzine  is effective  then he can let me know and I will send in a 4-month supply when it is due.  If not he can call.  I may increase trazodone  up to 300 mg since he does have a history of depression however increasing the dose may not be any more beneficial for insomnia than it is now.  We will just have to wait and see.  There is also the delicate balance of not flipping into a manic state.  He understands and knows what to watch for. If he is sleeping well then he can wean off the trazodone  and about a week by taking 100 mg nightly for 1 week and then stop it.  Continue Tegretol  200 mg, 1 po q am, 2 qhs.  Start hydroxyzine  25 mg, 1/2-1 p.o. every 8 H as needed. Continue vitamins as per med list.   Continue therapy w/ [facility] in 6 months.  Verneita Cooks, PA-C

## 2023-12-12 ENCOUNTER — Other Ambulatory Visit: Payer: Self-pay | Admitting: Family Medicine

## 2023-12-12 DIAGNOSIS — I10 Essential (primary) hypertension: Secondary | ICD-10-CM

## 2023-12-14 ENCOUNTER — Other Ambulatory Visit: Payer: Self-pay | Admitting: Physician Assistant

## 2023-12-14 NOTE — Telephone Encounter (Signed)
 From 7/10: If he is sleeping well then he can wean off the trazodone  and about a week by taking 100 mg nightly for 1 week and then stop it.

## 2023-12-15 NOTE — Telephone Encounter (Signed)
 LVM per DPR to call about dosing.

## 2023-12-15 NOTE — Telephone Encounter (Signed)
 Pt said he doesn't need a RF.

## 2024-01-05 ENCOUNTER — Telehealth: Payer: Self-pay | Admitting: Physician Assistant

## 2024-01-05 MED ORDER — TRAZODONE HCL 100 MG PO TABS: ORAL_TABLET | ORAL | 1 refills | Status: DC

## 2024-01-05 NOTE — Telephone Encounter (Signed)
 Pt last seen 7/10.  If the hydroxyzine  is effective then he can let me know and I will send in a 56-month supply when it is due. If not he can call. I may increase trazodone  up to 300 mg since he does have a history of depression however increasing the dose may not be any more beneficial for insomnia than it is now. We will just have to wait and see.   He reports since stopping the trazodone  his depression has increased and he would like to go back on it. He was last on 200 mg. Will send Rx as appropriate.   [facility] - [address], [place]

## 2024-01-05 NOTE — Telephone Encounter (Signed)
 Pt called in and LM at 12:42. He had weaned off Trazodone  and was to try another medication. It didn't work and he would like to go back on the Trazodone . He will need a RF please send to [facility] on [address] in [place], [place]

## 2024-01-05 NOTE — Telephone Encounter (Signed)
 Rx sent as requested.

## 2024-01-05 NOTE — Telephone Encounter (Signed)
 Please send in 200 mg, 1/2 at bedtime for 1 week and then 1 at bedtime. 90 days w/ RF is fine.  I don't want to go right up to 300 mg, he may not need it.

## 2024-03-16 ENCOUNTER — Other Ambulatory Visit: Payer: Self-pay | Admitting: Family Medicine

## 2024-03-16 DIAGNOSIS — I10 Essential (primary) hypertension: Secondary | ICD-10-CM

## 2024-03-25 ENCOUNTER — Encounter: Payer: Self-pay | Admitting: Family Medicine

## 2024-03-25 ENCOUNTER — Ambulatory Visit: Admitting: Family Medicine

## 2024-03-25 VITALS — BP 130/82 | HR 69 | Ht 69.0 in | Wt 180.0 lb

## 2024-03-25 DIAGNOSIS — E782 Mixed hyperlipidemia: Secondary | ICD-10-CM

## 2024-03-25 DIAGNOSIS — E559 Vitamin D deficiency, unspecified: Secondary | ICD-10-CM | POA: Diagnosis not present

## 2024-03-25 DIAGNOSIS — M62838 Other muscle spasm: Secondary | ICD-10-CM

## 2024-03-25 DIAGNOSIS — R7301 Impaired fasting glucose: Secondary | ICD-10-CM | POA: Diagnosis not present

## 2024-03-25 DIAGNOSIS — I10 Essential (primary) hypertension: Secondary | ICD-10-CM

## 2024-03-25 DIAGNOSIS — E038 Other specified hypothyroidism: Secondary | ICD-10-CM

## 2024-03-25 MED ORDER — CYCLOBENZAPRINE HCL 5 MG PO TABS: 5.0000 mg | ORAL_TABLET | Freq: Three times a day (TID) | ORAL | 1 refills | Status: AC | PRN

## 2024-03-25 NOTE — Assessment & Plan Note (Signed)
 Controlled He takes olmesartan  20 mg daily Denies headaches, dizziness, blurred vision, chest pain, palpitation and shortness of breath He reports adherence to lifestyle modification of low-sodium diet and increase physical activity BP Readings from Last 3 Encounters:  03/25/24 130/82  11/20/23 130/80  09/23/23 118/70

## 2024-03-25 NOTE — Assessment & Plan Note (Signed)
 Back Muscle Spasms -Start Cyclobenzaprine (Flexeril) 5 mg at bedtime, may increase to 10 mg if needed for muscle relaxation. -Ibuprofen 600 mg every 6 hours as needed for pain and inflammation (take with food to avoid stomach upset). -Avoid alcohol or driving while taking muscle relaxants due to drowsiness risk.  Nonpharmacological Interventions: -Apply heat or warm compresses to the affected area 2-3 times daily to relieve stiffness. -Perform gentle stretching and light activity as tolerated -- avoid prolonged bed rest. -Massage therapy or foam rolling may help loosen tight muscles. -Maintain good posture and use ergonomic support when sitting or working. -Stay hydrated and avoid heavy lifting until symptoms improve.

## 2024-03-25 NOTE — Patient Instructions (Addendum)
 I appreciate the opportunity to provide care to you today!    Follow up:  4 months  Labs: please stop by the lab today to get your blood drawn (CBC, CMP, TSH, Lipid profile, HgA1c, Vit D)  Back Muscle Spasms -Start Cyclobenzaprine (Flexeril) 5 mg at bedtime, may increase to 10 mg if needed for muscle relaxation. -Ibuprofen 600 mg every 6 hours as needed for pain and inflammation (take with food to avoid stomach upset). -Avoid alcohol or driving while taking muscle relaxants due to drowsiness risk.  Nonpharmacological Interventions: -Apply heat or warm compresses to the affected area 2-3 times daily to relieve stiffness. -Perform gentle stretching and light activity as tolerated -- avoid prolonged bed rest. -Massage therapy or foam rolling may help loosen tight muscles. -Maintain good posture and use ergonomic support when sitting or working. -Stay hydrated and avoid heavy lifting until symptoms improve.  Seek urgent care if experiencing new symptoms   Please follow up if your symptoms worsen or fail to improve.    Please continue to a heart-healthy diet and increase your physical activities. Try to exercise for at least five days a week.    It was a pleasure to see you and I look forward to continuing to work together on your health and well-being. Please do not hesitate to call the office if you need care or have questions about your care.  In case of emergency, please visit the Emergency Department for urgent care, or contact our clinic at [phone] to schedule an appointment. We're here to help you!   Have a wonderful day and week. With Gratitude, Meade JENEANE Gerlach MSN, FNP-BC, PMHNP-BC

## 2024-03-25 NOTE — Progress Notes (Signed)
 Established Patient Office Visit  Subjective:  Patient ID: Trevor Yoder, male    DOB: [birth year]  Age: 58 y.o. MRN: [id number]  CC:  Chief Complaint  Patient presents with   Hypertension    Follow up    Spasms    Muscle spasms in back for about a week     HPI Trevor Yoder is a 58 y.o. male with past medical history of hypertension presents for f/u of  chronic medical conditions.  The patient reports muscle spasms in the back for about one week. The pain is described as intermittent tightening and discomfort, without any known injury or trauma. No radiation of pain, numbness, or tingling reported. Over-the-counter medications have provided minimal relief.  .   Past Medical History:  Diagnosis Date   Bipolar disorder (HCC)    Mood disorder     Past Surgical History:  Procedure Laterality Date   CERVICAL DISCECTOMY  2011   COLONOSCOPY WITH PROPOFOL  N/A 03/13/2022   Procedure: COLONOSCOPY WITH PROPOFOL ;  Surgeon: Cindie Carlin POUR, DO;  Location: [facility];  Service: Endoscopy;  Laterality: N/A;  7:30am, asa 2   POLYPECTOMY  03/13/2022   Procedure: POLYPECTOMY INTESTINAL;  Surgeon: Cindie Carlin POUR, DO;  Location: [facility];  Service: Endoscopy;;    Family History  Problem Relation Age of Onset   Diabetes Mother    Hyperlipidemia Mother    Hypertension Mother    Heart disease Maternal Grandmother    Cancer Paternal Grandfather    Breast cancer Neg Hx     Social History   Socioeconomic History   Marital status: Married    Spouse name: Not on file   Number of children: Not on file   Years of education: 16   Highest education level: Not on file  Occupational History   Occupation: Programmer  Tobacco Use   Smoking status: Former    Current packs/day: 0.00    Average packs/day: 0.5 packs/day for 26.0 years (13.0 ttl pk-yrs)    Types: Cigarettes    Start date: 04/28/1983    Quit date: 04/27/2009    Years since quitting: 14.9   Smokeless  tobacco: Never  Vaping Use   Vaping status: Never Used  Substance and Sexual Activity   Alcohol use: Yes    Alcohol/week: 14.0 standard drinks of alcohol    Types: 14 Cans of beer per week   Drug use: No   Sexual activity: Yes    Partners: Female  Other Topics Concern   Not on file  Social History Narrative   Not on file   Social Drivers of Health   Financial Resource Strain: Not on file  Food Insecurity: Not on file  Transportation Needs: Not on file  Physical Activity: Not on file  Stress: Not on file  Social Connections: Not on file  Intimate Partner Violence: Not on file    Outpatient Medications Prior to Visit  Medication Sig Dispense Refill   b complex vitamins capsule Take 1 capsule by mouth daily.     carbamazepine  (TEGRETOL ) 200 MG tablet TAKE 1 TABLET BY MOUTH EVERY MORNING THEN TAKE 2 TABLETS BY MOUTH EVERY NIGHT AT BEDTIME 270 tablet 1   Cholecalciferol (VITAMIN D ) 50 MCG (2000 UT) tablet Take 2,000 Units by mouth daily.     clotrimazole -betamethasone  (LOTRISONE ) cream Apply 1 Application topically 2 (two) times daily. Please cancel Kenalog  prescription. 60 g 0   hydrOXYzine  (ATARAX ) 25 MG tablet Take 0.5-1 tablets (12.5-25 mg  total) by mouth every 8 (eight) hours as needed. 60 tablet 1   olmesartan  (BENICAR ) 20 MG tablet TAKE 1 TABLET(20 MG) BY MOUTH DAILY 90 tablet 1   Omega-3 Fatty Acids (FISH OIL PO) Take 1 capsule by mouth daily.     rosuvastatin  (CRESTOR ) 10 MG tablet Take 1 tablet (10 mg total) by mouth daily. 90 tablet 3   traZODone  (DESYREL ) 100 MG tablet Take 100 mg for one week and then increase to 200 mg. 180 tablet 1   No facility-administered medications prior to visit.    Allergies  Allergen Reactions   Luvox  [Fluvoxamine ] Other (See Comments)    Manic behavior    ROS Review of Systems  Constitutional:  Negative for fatigue and fever.  Eyes:  Negative for visual disturbance.  Respiratory:  Negative for chest tightness and shortness of  breath.   Cardiovascular:  Negative for chest pain and palpitations.  Musculoskeletal:        Muscle spasm of the back  Neurological:  Negative for dizziness and headaches.      Objective:    Physical Exam HENT:     Head: Normocephalic.     Right Ear: External ear normal.     Left Ear: External ear normal.     Nose: No congestion or rhinorrhea.     Mouth/Throat:     Mouth: Mucous membranes are moist.  Cardiovascular:     Rate and Rhythm: Regular rhythm.     Heart sounds: No murmur heard. Pulmonary:     Effort: No respiratory distress.     Breath sounds: Normal breath sounds.  Neurological:     Mental Status: He is alert.     BP 130/82   Pulse 69   Ht 5' 9 (1.753 m)   Wt 180 lb (81.6 kg)   SpO2 98%   BMI 26.58 kg/m  Wt Readings from Last 3 Encounters:  03/25/24 180 lb (81.6 kg)  11/20/23 180 lb (81.6 kg)  09/23/23 179 lb 9.6 oz (81.5 kg)    Lab Results  Component Value Date   TSH 1.320 11/20/2023   Lab Results  Component Value Date   WBC 3.5 11/20/2023   HGB 14.4 11/20/2023   HCT 43.2 11/20/2023   MCV 98 (H) 11/20/2023   PLT 218 11/20/2023   Lab Results  Component Value Date   NA 127 (L) 11/20/2023   K 4.6 11/20/2023   CO2 23 11/20/2023   GLUCOSE 92 11/20/2023   BUN 11 11/20/2023   CREATININE 0.67 (L) 11/20/2023   BILITOT 0.5 11/20/2023   ALKPHOS 55 11/20/2023   AST 17 11/20/2023   ALT 17 11/20/2023   PROT 6.5 11/20/2023   ALBUMIN 4.7 11/20/2023   CALCIUM  8.8 11/20/2023   ANIONGAP 12 12/28/2021   EGFR 110 11/20/2023   Lab Results  Component Value Date   CHOL 245 (H) 11/20/2023   Lab Results  Component Value Date   HDL 68 11/20/2023   Lab Results  Component Value Date   LDLCALC 157 (H) 11/20/2023   Lab Results  Component Value Date   TRIG 113 11/20/2023   Lab Results  Component Value Date   CHOLHDL 3.6 11/20/2023   Lab Results  Component Value Date   HGBA1C 5.4 11/20/2023      Assessment & Plan:  Primary  hypertension Assessment & Plan: Controlled He takes olmesartan  20 mg daily Denies headaches, dizziness, blurred vision, chest pain, palpitation and shortness of breath He reports adherence to lifestyle modification  of low-sodium diet and increase physical activity BP Readings from Last 3 Encounters:  03/25/24 130/82  11/20/23 130/80  09/23/23 118/70      Muscle spasm Assessment & Plan: Back Muscle Spasms -Start Cyclobenzaprine (Flexeril) 5 mg at bedtime, may increase to 10 mg if needed for muscle relaxation. -Ibuprofen 600 mg every 6 hours as needed for pain and inflammation (take with food to avoid stomach upset). -Avoid alcohol or driving while taking muscle relaxants due to drowsiness risk.  Nonpharmacological Interventions: -Apply heat or warm compresses to the affected area 2-3 times daily to relieve stiffness. -Perform gentle stretching and light activity as tolerated -- avoid prolonged bed rest. -Massage therapy or foam rolling may help loosen tight muscles. -Maintain good posture and use ergonomic support when sitting or working. -Stay hydrated and avoid heavy lifting until symptoms improve.  Orders: -     Cyclobenzaprine HCl; Take 1 tablet (5 mg total) by mouth 3 (three) times daily as needed for muscle spasms.  Dispense: 30 tablet; Refill: 1  IFG (impaired fasting glucose) -     Hemoglobin A1c  Vitamin D  deficiency -     VITAMIN D  25 Hydroxy (Vit-D Deficiency, Fractures)  TSH (thyroid-stimulating hormone deficiency) -     TSH + free T4  Mixed hyperlipidemia -     Lipid panel -     CMP14+EGFR -     CBC with Differential/Platelet   Note: This chart has been completed using Engineer, civil (consulting) software, and while attempts have been made to ensure accuracy, certain words and phrases may not be transcribed as intended.   Follow-up: Return in about 4 months (around 07/26/2024).   Carry Weesner  Z Bacchus, FNP

## 2024-04-03 LAB — CBC WITH DIFFERENTIAL/PLATELET
Basophils Absolute: 0 x10E3/uL (ref 0.0–0.2)
Basos: 1 %
EOS (ABSOLUTE): 0.1 x10E3/uL (ref 0.0–0.4)
Eos: 2 %
Hematocrit: 41.6 % (ref 37.5–51.0)
Hemoglobin: 14 g/dL (ref 13.0–17.7)
Immature Grans (Abs): 0 x10E3/uL (ref 0.0–0.1)
Immature Granulocytes: 0 %
Lymphocytes Absolute: 1 x10E3/uL (ref 0.7–3.1)
Lymphs: 31 %
MCH: 32.9 pg (ref 26.6–33.0)
MCHC: 33.7 g/dL (ref 31.5–35.7)
MCV: 98 fL — ABNORMAL HIGH (ref 79–97)
Monocytes Absolute: 0.5 x10E3/uL (ref 0.1–0.9)
Monocytes: 15 %
Neutrophils Absolute: 1.7 x10E3/uL (ref 1.4–7.0)
Neutrophils: 51 %
Platelets: 222 x10E3/uL (ref 150–450)
RBC: 4.26 x10E6/uL (ref 4.14–5.80)
RDW: 12.1 % (ref 11.6–15.4)
WBC: 3.2 x10E3/uL — ABNORMAL LOW (ref 3.4–10.8)

## 2024-04-03 LAB — CMP14+EGFR
ALT: 19 IU/L (ref 0–44)
AST: 18 IU/L (ref 0–40)
Albumin: 4.7 g/dL (ref 3.8–4.9)
Alkaline Phosphatase: 60 IU/L (ref 47–123)
BUN/Creatinine Ratio: 13 (ref 9–20)
BUN: 9 mg/dL (ref 6–24)
Bilirubin Total: 0.5 mg/dL (ref 0.0–1.2)
CO2: 25 mmol/L (ref 20–29)
Calcium: 8.4 mg/dL — ABNORMAL LOW (ref 8.7–10.2)
Chloride: 91 mmol/L — ABNORMAL LOW (ref 96–106)
Creatinine, Ser: 0.72 mg/dL — ABNORMAL LOW (ref 0.76–1.27)
Globulin, Total: 1.7 g/dL (ref 1.5–4.5)
Glucose: 96 mg/dL (ref 70–99)
Potassium: 4.3 mmol/L (ref 3.5–5.2)
Sodium: 128 mmol/L — ABNORMAL LOW (ref 134–144)
Total Protein: 6.4 g/dL (ref 6.0–8.5)
eGFR: 107 mL/min/1.73 (ref >59)

## 2024-04-03 LAB — HEMOGLOBIN A1C
Est. average glucose Bld gHb Est-mCnc: 108 mg/dL
Hgb A1c MFr Bld: 5.4 % (ref 4.8–5.6)

## 2024-04-03 LAB — LIPID PANEL
Chol/HDL Ratio: 2.6 ratio (ref 0.0–5.0)
Cholesterol, Total: 170 mg/dL (ref 100–199)
HDL: 66 mg/dL (ref >39)
LDL Chol Calc (NIH): 86 mg/dL (ref 0–99)
Triglycerides: 99 mg/dL (ref 0–149)
VLDL Cholesterol Cal: 18 mg/dL (ref 5–40)

## 2024-04-03 LAB — TSH+FREE T4
Free T4: 0.96 ng/dL (ref 0.82–1.77)
TSH: 1.44 u[IU]/mL (ref 0.450–4.500)

## 2024-04-03 LAB — VITAMIN D 25 HYDROXY (VIT D DEFICIENCY, FRACTURES): Vit D, 25-Hydroxy: 39.6 ng/mL (ref 30.0–100.0)

## 2024-04-25 ENCOUNTER — Ambulatory Visit: Payer: Self-pay | Admitting: Family Medicine

## 2024-06-14 ENCOUNTER — Encounter: Payer: Self-pay | Admitting: Physician Assistant

## 2024-06-14 ENCOUNTER — Telehealth: Admitting: Physician Assistant

## 2024-06-14 DIAGNOSIS — F411 Generalized anxiety disorder: Secondary | ICD-10-CM | POA: Diagnosis not present

## 2024-06-14 DIAGNOSIS — Z79899 Other long term (current) drug therapy: Secondary | ICD-10-CM

## 2024-06-14 DIAGNOSIS — G4700 Insomnia, unspecified: Secondary | ICD-10-CM | POA: Diagnosis not present

## 2024-06-14 DIAGNOSIS — F39 Unspecified mood [affective] disorder: Secondary | ICD-10-CM

## 2024-06-14 MED ORDER — CARBAMAZEPINE 200 MG PO TABS: 200.0000 mg | ORAL_TABLET | Freq: Two times a day (BID) | ORAL | 1 refills | Status: AC

## 2024-06-14 MED ORDER — TRAZODONE HCL 100 MG PO TABS: ORAL_TABLET | ORAL | 1 refills | Status: AC

## 2024-06-14 NOTE — Progress Notes (Signed)
 "     [facility] Check  Patient ID: Trevor Yoder,  MRN: [id number]  PCP: Trevor Meade PEDLAR, FNP  Date of Evaluation: 06/14/2024 Time spent:30 minutes  Chief Complaint:  Chief Complaint   Follow-up    Virtual Visit via Telehealth  I connected with patient by a video enabled telemedicine application, with their informed consent, and verified patient privacy and that I am speaking with the correct person using two identifiers.  I am private, in my office and the patient is at home.  I discussed the limitations, risks, security and privacy concerns of performing an evaluation and management service by video and the availability of in person appointments. I also discussed with the patient that there may be a patient responsible charge related to this service. The patient expressed understanding and agreed to proceed.   I discussed the assessment and treatment plan with the patient. The patient was provided an opportunity to ask questions and all were answered. The patient agreed with the plan and demonstrated an understanding of the instructions.   The patient was advised to call back or seek an in-person evaluation if the symptoms worsen or if the condition fails to improve as anticipated.  I provided  30 minutes of non-face-to-face time during this encounter.  HISTORY/CURRENT STATUS: HPI  For routine 78-month med check.   Doing well, he decreased the CBZ 200 mg, 1 po bid about 2 months ago.  He just wanted to see how his mental health would be after the decrease.  He wants to take the least amount of medication that he can.  His mood has been fine.  States he has not had any blowups.  He is not working but has a lot of projects to do around the house.  Energy and motivation are good.  ADLs and personal hygiene are normal.  Appetite is normal.  Weight stable.  He sleeps well with trazodone .  No complaints of anxiety.  He has not been in therapy for approximately 1 year.  It was very  helpful and he has put into practice some cognitive behaviors that he learned.  He still drinks only about 2 beers per day.  No suicidal or homicidal thoughts.  No reports of increased energy with decreased need for sleep, increased talkativeness, racing thoughts, impulsivity or risky behaviors, increased spending, increased libido, grandiosity, increased irritability or anger, paranoia, or hallucinations.  Individual Medical History/ Review of Systems: Changes? :Yes  Chronic hyponatremia     Past medications for mental health diagnoses include: Equetro , trazodone , Sonata  didn't help, Melatonin, Luvox  caused mania  Allergies: Luvox  [fluvoxamine ]  Current Medications:  Current Outpatient Medications:    b complex vitamins capsule, Take 1 capsule by mouth daily., Disp: , Rfl:    Cholecalciferol (VITAMIN D ) 50 MCG (2000 UT) tablet, Take 2,000 Units by mouth daily., Disp: , Rfl:    clotrimazole -betamethasone  (LOTRISONE ) cream, Apply 1 Application topically 2 (two) times daily. Please cancel Kenalog  prescription., Disp: 60 g, Rfl: 0   olmesartan  (BENICAR ) 20 MG tablet, TAKE 1 TABLET(20 MG) BY MOUTH DAILY, Disp: 90 tablet, Rfl: 1   Omega-3 Fatty Acids (FISH OIL PO), Take 1 capsule by mouth daily., Disp: , Rfl:    rosuvastatin  (CRESTOR ) 10 MG tablet, Take 1 tablet (10 mg total) by mouth daily., Disp: 90 tablet, Rfl: 3   carbamazepine  (TEGRETOL ) 200 MG tablet, Take 1 tablet (200 mg total) by mouth 2 (two) times daily., Disp: 180 tablet, Rfl: 1   cyclobenzaprine  (FLEXERIL )  5 MG tablet, Take 1 tablet (5 mg total) by mouth 3 (three) times daily as needed for muscle spasms. (Patient not taking: Reported on 06/14/2024), Disp: 30 tablet, Rfl: 1   traZODone  (DESYREL ) 100 MG tablet, Take 100 mg for one week and then increase to 200 mg., Disp: 180 tablet, Rfl: 1 Medication Side Effects: none  Family Medical/ Social History: Changes?  No  MENTAL HEALTH EXAM:  There were no vitals taken for this visit.There  is no height or weight on file to calculate BMI.  General Appearance: Casual, Neat and Well Groomed  Eye Contact:  Good  Speech:  Clear and Coherent and Normal Rate  Volume:  Normal  Mood:  Euthymic  Affect:  Congruent  Thought Process:  Goal Directed and Descriptions of Associations: Circumstantial  Orientation:  Full (Time, Place, and Person)  Thought Content: Logical   Suicidal Thoughts:  No  Homicidal Thoughts:  No  Memory:  WNL  Judgement:  Good  Insight:  Good  Psychomotor Activity:  Normal  Concentration:  Concentration: Good and Attention Span: Good  Recall:  Good  Fund of Knowledge: Good  Language: Good  Assets:  Communication Skills Desire for Improvement Financial Resources/Insurance Housing Resilience Social Support Transportation Vocational/Educational  ADL's:  Intact  Cognition: WNL  Prognosis:  Good   Labs 04/02/2024. CBC with differential, WBC 3.2, MCV 98, otherwise normal Hemoglobin A1c 5.4 Glucose 96, creatinine 0.72, sodium 128, chloride 91, calcium  8.4, LFTs completely normal Lipid panel completely normal TSH 1.4 Vitamin D  39.6.  Last carbamazepine  was 06/06/2023, 8.1  DIAGNOSES:    ICD-10-CM   1. Episodic mood disorder  F39 Carbamazepine  level, total    2. Generalized anxiety disorder  F41.1     3. Insomnia, unspecified type  G47.00     4. Encounter for long-term (current) use of medications  Z79.899 Carbamazepine  level, total     Receiving Psychotherapy: No   [name] in the past.  RECOMMENDATIONS:  PDMP was reviewed.  No recent controlled substances. I provided approximately  30 minutes of non-face-to-face time during this encounter, including time spent before and after the visit in records review, medical decision making, counseling pertinent to today's visit, and charting.   He is doing well on the current medications so no changes need to be made.  His PCP is following the low sodium.    Continue Tegretol  200 mg, 1 po  bid. Continue trazodone  100 mg, 1-2 nightly as needed sleep. Continue vitamins as per med list.   Carbamazepine  ordered as above.  He will have it done when he sees his PCP within the next month or so. Return in 6 months.  Verneita Cooks, PA-C  "

## 2024-07-26 ENCOUNTER — Ambulatory Visit: Admitting: Family Medicine

## 2024-12-23 ENCOUNTER — Ambulatory Visit: Admitting: Physician Assistant
